# Patient Record
Sex: Male | Born: 1970
Health system: Southern US, Community
[De-identification: ages and names within clinical notes are randomized; demographics above are authoritative.]

## PROBLEM LIST (undated history)

## (undated) DIAGNOSIS — U071 COVID-19: Secondary | ICD-10-CM

## (undated) DIAGNOSIS — J302 Other seasonal allergic rhinitis: Secondary | ICD-10-CM

## (undated) DIAGNOSIS — I2510 Atherosclerotic heart disease of native coronary artery without angina pectoris: Secondary | ICD-10-CM

## (undated) DIAGNOSIS — I351 Nonrheumatic aortic (valve) insufficiency: Secondary | ICD-10-CM

## (undated) DIAGNOSIS — M6281 Muscle weakness (generalized): Secondary | ICD-10-CM

## (undated) HISTORY — PX: DENTAL SURGERY: SHX609

## (undated) HISTORY — PX: VASECTOMY: SHX75

## (undated) HISTORY — DX: COVID-19: U07.1

## (undated) HISTORY — DX: Muscle weakness (generalized): M62.81

## (undated) HISTORY — DX: Atherosclerotic heart disease of native coronary artery without angina pectoris: I25.10

## (undated) HISTORY — DX: Nonrheumatic aortic (valve) insufficiency: I35.1

---

## 2011-08-15 ENCOUNTER — Emergency Department (HOSPITAL_COMMUNITY)
Admission: EM | Admit: 2011-08-15 | Discharge: 2011-08-15 | Disposition: A | Discharge status: Home / Self Care | Payer: 59 | Source: Home / Self Care | Attending: Emergency Medicine | Admitting: Emergency Medicine

## 2011-08-15 ENCOUNTER — Encounter: Payer: Self-pay | Admitting: *Deleted

## 2011-08-15 DIAGNOSIS — J329 Chronic sinusitis, unspecified: Secondary | ICD-10-CM

## 2011-08-15 DIAGNOSIS — J40 Bronchitis, not specified as acute or chronic: Secondary | ICD-10-CM

## 2011-08-15 HISTORY — DX: Other seasonal allergic rhinitis: J30.2

## 2011-08-15 MED ORDER — FEXOFENADINE-PSEUDOEPHED ER 60-120 MG PO TB12: 1.0000 | ORAL_TABLET | Freq: Two times a day (BID) | ORAL | Status: AC

## 2011-08-15 MED ORDER — AMOXICILLIN 500 MG PO TABS: 500.0000 mg | ORAL_TABLET | Freq: Three times a day (TID) | ORAL | Status: AC

## 2011-08-15 NOTE — ED Provider Notes (Addendum)
History     CSN: [id number] Arrival date & time: 08/15/2011  1:37 PM   First MD Initiated Contact with Patient 08/15/11 1211      Chief Complaint  Patient presents with  . Nasal Congestion  . Cough    (Consider location/radiation/quality/duration/timing/severity/associated sxs/prior treatment) HPI Comments: Coughing for 3 weeks, and worsenin pressure on my sinuses, don like to come to the doctor, but the coughing is worse now, and now with colored phlegm "  Patient is a 40 y.o. male presenting with cough. The history is provided by the patient.  Cough This is a recurrent (3 weeks) problem. The problem has been gradually worsening. The cough is productive of sputum. The maximum temperature recorded prior to his arrival was 100 to 100.9 F. Associated symptoms include chills, sore throat and myalgias. Pertinent negatives include no shortness of breath and no wheezing. He has tried decongestants and cough syrup for the symptoms. The treatment provided no relief. He is not a smoker. His past medical history does not include pneumonia or asthma.    Past Medical History  Diagnosis Date  . Seasonal allergies     History reviewed. No pertinent past surgical history.  History reviewed. No pertinent family history.  History  Substance Use Topics  . Smoking status: Never Smoker   . Smokeless tobacco: Not on file  . Alcohol Use: Yes      Review of Systems  Constitutional: Positive for chills.  HENT: Positive for sore throat.   Respiratory: Positive for cough. Negative for shortness of breath and wheezing.   Musculoskeletal: Positive for myalgias.    Allergies  Review of patient's allergies indicates no known allergies.  Home Medications   Current Outpatient Rx  Name Route Sig Dispense Refill  . AMOXICILLIN 500 MG PO TABS Oral Take 1 tablet (500 mg total) by mouth 3 (three) times daily. X 10 DAYS 30 tablet 0  . FEXOFENADINE-PSEUDOEPHED ER 60-120 MG PO TB12 Oral Take 1 tablet  by mouth 2 (two) times daily. 14 tablet 0    BP 108/70  Pulse 75  Temp(Src) 98.1 F (36.7 C) (Oral)  Resp 20  SpO2 100%  Physical Exam  Nursing note and vitals reviewed. Constitutional: He appears well-developed and well-nourished.  HENT:  Head: Normocephalic.  Nose: Right sinus exhibits maxillary sinus tenderness and frontal sinus tenderness. Left sinus exhibits maxillary sinus tenderness and frontal sinus tenderness.  Eyes: Conjunctivae are normal. Right eye exhibits no discharge.  Neck: Trachea normal and normal range of motion. Neck supple. No JVD present.  Cardiovascular: Normal rate.   Pulmonary/Chest: Effort normal. No respiratory distress. He has no decreased breath sounds. He has no wheezes. He has no rhonchi. He has no rales. He exhibits no tenderness.  Lymphadenopathy:    He has no cervical adenopathy.    ED Course  Procedures (including critical care time)  Labs Reviewed - No data to display No results found.   1. Bronchitis   2. Sinusitis       MDM  Worsening ongoing Respiratoy and sinusitis  Symptoms, with decongestant and antihistamines        Jimmie Molly, MD 08/15/11 4098  Jimmie Molly, MD 08/15/11 2234

## 2011-08-15 NOTE — ED Notes (Signed)
Pt with c/o cough x 2 weeks /sinus congestion x one week - onset of facial pressure sinus onset today

## 2013-11-10 ENCOUNTER — Ambulatory Visit
Admission: RE | Admit: 2013-11-10 | Discharge: 2013-11-10 | Disposition: A | Discharge status: Home / Self Care | Payer: 59 | Source: Ambulatory Visit | Attending: Family Medicine | Admitting: Family Medicine

## 2013-11-10 ENCOUNTER — Other Ambulatory Visit: Payer: Self-pay | Admitting: Family Medicine

## 2013-11-10 DIAGNOSIS — M545 Low back pain, unspecified: Secondary | ICD-10-CM

## 2013-11-16 ENCOUNTER — Ambulatory Visit: Payer: 59 | Attending: Family Medicine

## 2013-11-16 DIAGNOSIS — R5381 Other malaise: Secondary | ICD-10-CM | POA: Insufficient documentation

## 2013-11-16 DIAGNOSIS — IMO0001 Reserved for inherently not codable concepts without codable children: Secondary | ICD-10-CM | POA: Insufficient documentation

## 2013-11-16 DIAGNOSIS — M25519 Pain in unspecified shoulder: Secondary | ICD-10-CM | POA: Insufficient documentation

## 2013-11-21 ENCOUNTER — Ambulatory Visit: Payer: 59

## 2013-11-23 ENCOUNTER — Ambulatory Visit: Payer: 59 | Admitting: Rehabilitation

## 2013-11-27 ENCOUNTER — Ambulatory Visit: Payer: 59

## 2013-11-29 ENCOUNTER — Ambulatory Visit: Payer: 59 | Admitting: Rehabilitation

## 2013-12-04 ENCOUNTER — Ambulatory Visit: Payer: 59

## 2013-12-07 ENCOUNTER — Ambulatory Visit: Payer: 59 | Attending: Family Medicine

## 2013-12-07 DIAGNOSIS — M25519 Pain in unspecified shoulder: Secondary | ICD-10-CM | POA: Insufficient documentation

## 2013-12-07 DIAGNOSIS — IMO0001 Reserved for inherently not codable concepts without codable children: Secondary | ICD-10-CM | POA: Insufficient documentation

## 2013-12-07 DIAGNOSIS — R5381 Other malaise: Secondary | ICD-10-CM | POA: Insufficient documentation

## 2013-12-13 ENCOUNTER — Other Ambulatory Visit (HOSPITAL_COMMUNITY): Payer: Self-pay | Admitting: Orthopedic Surgery

## 2013-12-13 ENCOUNTER — Ambulatory Visit: Payer: 59 | Admitting: Rehabilitation

## 2013-12-13 DIAGNOSIS — M25552 Pain in left hip: Secondary | ICD-10-CM

## 2013-12-15 ENCOUNTER — Ambulatory Visit: Payer: 59

## 2013-12-20 ENCOUNTER — Ambulatory Visit (HOSPITAL_COMMUNITY): Payer: 59

## 2013-12-26 ENCOUNTER — Ambulatory Visit (HOSPITAL_COMMUNITY): Payer: 59

## 2013-12-27 ENCOUNTER — Ambulatory Visit: Payer: 59

## 2013-12-28 ENCOUNTER — Ambulatory Visit (HOSPITAL_COMMUNITY)
Admission: RE | Admit: 2013-12-28 | Discharge: 2013-12-28 | Disposition: A | Discharge status: Home / Self Care | Payer: 59 | Source: Ambulatory Visit | Attending: Orthopedic Surgery | Admitting: Orthopedic Surgery

## 2013-12-28 DIAGNOSIS — M25552 Pain in left hip: Secondary | ICD-10-CM

## 2013-12-28 DIAGNOSIS — M25559 Pain in unspecified hip: Secondary | ICD-10-CM | POA: Insufficient documentation

## 2013-12-28 DIAGNOSIS — M549 Dorsalgia, unspecified: Secondary | ICD-10-CM | POA: Insufficient documentation

## 2014-01-08 ENCOUNTER — Ambulatory Visit: Payer: 59 | Attending: Family Medicine

## 2014-01-08 DIAGNOSIS — IMO0001 Reserved for inherently not codable concepts without codable children: Secondary | ICD-10-CM | POA: Insufficient documentation

## 2014-01-08 DIAGNOSIS — M25519 Pain in unspecified shoulder: Secondary | ICD-10-CM | POA: Insufficient documentation

## 2014-01-08 DIAGNOSIS — R5381 Other malaise: Secondary | ICD-10-CM | POA: Insufficient documentation

## 2014-01-16 ENCOUNTER — Ambulatory Visit: Payer: 59

## 2014-01-31 ENCOUNTER — Encounter (INDEPENDENT_AMBULATORY_CARE_PROVIDER_SITE_OTHER): Payer: Self-pay

## 2014-01-31 ENCOUNTER — Telehealth (INDEPENDENT_AMBULATORY_CARE_PROVIDER_SITE_OTHER): Payer: Self-pay

## 2014-01-31 ENCOUNTER — Ambulatory Visit (INDEPENDENT_AMBULATORY_CARE_PROVIDER_SITE_OTHER): Payer: Commercial Managed Care - PPO | Admitting: General Surgery

## 2014-01-31 ENCOUNTER — Encounter (INDEPENDENT_AMBULATORY_CARE_PROVIDER_SITE_OTHER): Payer: Self-pay | Admitting: General Surgery

## 2014-01-31 VITALS — BP 128/70 | HR 70 | Temp 97.2°F | Resp 12 | Ht 70.0 in | Wt 186.4 lb

## 2014-01-31 DIAGNOSIS — M25569 Pain in unspecified knee: Secondary | ICD-10-CM

## 2014-01-31 NOTE — Progress Notes (Signed)
Patient ID: Mitchell Melendez, male   DOB: [birth year], 43 y.o.   MRN: [id number]  Chief Complaint  Patient presents with  . New Evaluation    eval [facility]    HPI Mitchell Melendez is a 43 y.o. male.  The patient is a 43 year old male who is referred by Dr. Mayer Camel for evaluation of a possible left inguinal hernia. The patient states he had some issues after having it could not fall while playing basketball in February. The patient's undergone several bouts of PT. Subsequently he underwent MRI which examined the patient for possible hip issue, was essentially normal. So far the workup has been negative.    The patient describes some pain originated from his left hip/lower back area and does have some radiation to his left groin area. He has noticed no bulge. She does state that he has pain when sneezing, coughing, straining for bowel movements. He does not give any typical symptoms of having more discomfort in the day as opposed to beginning the day. The pain is better throughout the day. Patient is currently taking Ibuprofen every 8 hours to help with pain relief.  HPI  Past Medical History  Diagnosis Date  . Seasonal allergies     History reviewed. No pertinent past surgical history.  History reviewed. No pertinent family history.  Social History History  Substance Use Topics  . Smoking status: Never Smoker   . Smokeless tobacco: Never Used  . Alcohol Use: Yes    No Known Allergies  Current Outpatient Prescriptions  Medication Sig Dispense Refill  . Fexofenadine HCl (ALLEGRA PO) Take by mouth 1 day or 1 dose.      . ibuprofen (ADVIL,MOTRIN) 600 MG tablet Take 600 mg by mouth every 6 (six) hours as needed for mild pain.       No current facility-administered medications for this visit.    Review of Systems Review of Systems  Constitutional: Negative.   HENT: Negative.   Eyes: Negative.   Respiratory: Negative.   Cardiovascular: Negative.   Gastrointestinal: Negative.    Endocrine: Negative.   Neurological: Negative.     Blood pressure 128/70, pulse 70, temperature 97.2 F (36.2 C), resp. rate 12, height 5\' 10"  (1.778 m), weight 186 lb 6.4 oz (84.55 kg).  Physical Exam Physical Exam  Constitutional: He is oriented to person, place, and time. He appears well-developed and well-nourished.  HENT:  Head: Normocephalic and atraumatic.  Eyes: Conjunctivae and EOM are normal. Pupils are equal, round, and reactive to light.  Neck: Normal range of motion. Neck supple.  Cardiovascular: Normal rate, regular rhythm and normal heart sounds.   Pulmonary/Chest: Effort normal and breath sounds normal.  Abdominal: Soft. Bowel sounds are normal. He exhibits no distension and no mass. There is no tenderness. There is no rebound and no guarding. No hernia. Hernia confirmed negative in the right inguinal area and confirmed negative in the left inguinal area.  Musculoskeletal: Normal range of motion.  Neurological: He is alert and oriented to person, place, and time.  Skin: Skin is warm and dry.    Data Reviewed MRI revealed normal anatomy, no hernia seen on my review.  Assessment    43 year old male with left lower back, hip, groin pain. At this time could not palpate a left hernia his inguinal area. Upon reviewing the MRI of both inguinal canals normal did not appear to have hernias either side.    Plan    1. I would have the patient  follow  up with Dr. Mayer Camel  To evaluate for possible herniated disc. 2. The patient follow up as needed       Ralene Ok 01/31/2014, 10:23 AM

## 2014-01-31 NOTE — Telephone Encounter (Signed)
Faxed office notes to Dr. Mayer Camel @ [phone]

## 2014-02-16 ENCOUNTER — Other Ambulatory Visit (HOSPITAL_COMMUNITY): Payer: Self-pay | Admitting: Family Medicine

## 2014-02-16 DIAGNOSIS — N508 Other specified disorders of male genital organs: Secondary | ICD-10-CM

## 2014-02-19 ENCOUNTER — Ambulatory Visit (HOSPITAL_COMMUNITY)
Admission: RE | Admit: 2014-02-19 | Discharge: 2014-02-19 | Disposition: A | Discharge status: Home / Self Care | Payer: 59 | Source: Ambulatory Visit | Attending: Family Medicine | Admitting: Family Medicine

## 2014-02-19 DIAGNOSIS — N508 Other specified disorders of male genital organs: Secondary | ICD-10-CM

## 2014-02-19 DIAGNOSIS — I861 Scrotal varices: Secondary | ICD-10-CM | POA: Insufficient documentation

## 2014-02-19 DIAGNOSIS — N509 Disorder of male genital organs, unspecified: Secondary | ICD-10-CM | POA: Insufficient documentation

## 2014-09-25 ENCOUNTER — Other Ambulatory Visit (HOSPITAL_COMMUNITY): Payer: Self-pay | Admitting: Orthopedic Surgery

## 2014-09-25 DIAGNOSIS — M545 Low back pain: Secondary | ICD-10-CM

## 2014-10-12 ENCOUNTER — Ambulatory Visit (HOSPITAL_COMMUNITY)
Admission: RE | Admit: 2014-10-12 | Discharge: 2014-10-12 | Disposition: A | Discharge status: Home / Self Care | Payer: 59 | Source: Ambulatory Visit | Attending: Orthopedic Surgery | Admitting: Orthopedic Surgery

## 2014-10-12 DIAGNOSIS — M5126 Other intervertebral disc displacement, lumbar region: Secondary | ICD-10-CM | POA: Diagnosis not present

## 2014-10-12 DIAGNOSIS — Z9181 History of falling: Secondary | ICD-10-CM | POA: Insufficient documentation

## 2014-10-12 DIAGNOSIS — R9089 Other abnormal findings on diagnostic imaging of central nervous system: Secondary | ICD-10-CM | POA: Diagnosis not present

## 2014-10-12 DIAGNOSIS — M545 Low back pain: Secondary | ICD-10-CM | POA: Diagnosis present

## 2015-11-01 DIAGNOSIS — J3081 Allergic rhinitis due to animal (cat) (dog) hair and dander: Secondary | ICD-10-CM | POA: Diagnosis not present

## 2015-11-01 DIAGNOSIS — J301 Allergic rhinitis due to pollen: Secondary | ICD-10-CM | POA: Diagnosis not present

## 2015-11-01 DIAGNOSIS — J3089 Other allergic rhinitis: Secondary | ICD-10-CM | POA: Diagnosis not present

## 2015-11-01 DIAGNOSIS — H1045 Other chronic allergic conjunctivitis: Secondary | ICD-10-CM | POA: Diagnosis not present

## 2015-11-01 MED FILL — EPINEPHRINE 0.3 MG AUTO-INJ: 0.3 | 30 days supply | Qty: 2 | Fill #0

## 2015-11-01 MED FILL — FLUTICASONE PROP 50 MCG SPR: 50 | 30 days supply | Qty: 16 | Fill #0

## 2015-11-01 MED FILL — LEVOCETIRIZINE 5 MG TABLET: 5 | 30 days supply | Qty: 30 | Fill #0

## 2015-11-01 MED FILL — MONTELUKAST SOD 10 MG TAB: 10 | 30 days supply | Qty: 30 | Fill #0

## 2015-11-11 DIAGNOSIS — J3089 Other allergic rhinitis: Secondary | ICD-10-CM | POA: Diagnosis not present

## 2015-11-11 DIAGNOSIS — J3081 Allergic rhinitis due to animal (cat) (dog) hair and dander: Secondary | ICD-10-CM | POA: Diagnosis not present

## 2015-11-11 DIAGNOSIS — J301 Allergic rhinitis due to pollen: Secondary | ICD-10-CM | POA: Diagnosis not present

## 2015-12-05 DIAGNOSIS — J301 Allergic rhinitis due to pollen: Secondary | ICD-10-CM | POA: Diagnosis not present

## 2015-12-05 DIAGNOSIS — J3089 Other allergic rhinitis: Secondary | ICD-10-CM | POA: Diagnosis not present

## 2015-12-05 DIAGNOSIS — J3081 Allergic rhinitis due to animal (cat) (dog) hair and dander: Secondary | ICD-10-CM | POA: Diagnosis not present

## 2015-12-05 MED FILL — MONTELUKAST SOD 10 MG TAB: 10 | 30 days supply | Qty: 30 | Fill #1

## 2015-12-05 MED FILL — LEVOCETIRIZINE 5 MG TABLET: 5 | 30 days supply | Qty: 30 | Fill #1

## 2015-12-10 DIAGNOSIS — J3089 Other allergic rhinitis: Secondary | ICD-10-CM | POA: Diagnosis not present

## 2015-12-10 DIAGNOSIS — J3081 Allergic rhinitis due to animal (cat) (dog) hair and dander: Secondary | ICD-10-CM | POA: Diagnosis not present

## 2015-12-10 DIAGNOSIS — J301 Allergic rhinitis due to pollen: Secondary | ICD-10-CM | POA: Diagnosis not present

## 2015-12-13 DIAGNOSIS — J3081 Allergic rhinitis due to animal (cat) (dog) hair and dander: Secondary | ICD-10-CM | POA: Diagnosis not present

## 2015-12-13 DIAGNOSIS — J301 Allergic rhinitis due to pollen: Secondary | ICD-10-CM | POA: Diagnosis not present

## 2015-12-13 DIAGNOSIS — J3089 Other allergic rhinitis: Secondary | ICD-10-CM | POA: Diagnosis not present

## 2015-12-16 DIAGNOSIS — J301 Allergic rhinitis due to pollen: Secondary | ICD-10-CM | POA: Diagnosis not present

## 2015-12-16 DIAGNOSIS — J3089 Other allergic rhinitis: Secondary | ICD-10-CM | POA: Diagnosis not present

## 2015-12-16 DIAGNOSIS — J3081 Allergic rhinitis due to animal (cat) (dog) hair and dander: Secondary | ICD-10-CM | POA: Diagnosis not present

## 2015-12-18 DIAGNOSIS — J3089 Other allergic rhinitis: Secondary | ICD-10-CM | POA: Diagnosis not present

## 2015-12-18 DIAGNOSIS — J301 Allergic rhinitis due to pollen: Secondary | ICD-10-CM | POA: Diagnosis not present

## 2015-12-18 DIAGNOSIS — J3081 Allergic rhinitis due to animal (cat) (dog) hair and dander: Secondary | ICD-10-CM | POA: Diagnosis not present

## 2015-12-23 DIAGNOSIS — J301 Allergic rhinitis due to pollen: Secondary | ICD-10-CM | POA: Diagnosis not present

## 2015-12-23 DIAGNOSIS — J3081 Allergic rhinitis due to animal (cat) (dog) hair and dander: Secondary | ICD-10-CM | POA: Diagnosis not present

## 2015-12-23 DIAGNOSIS — J3089 Other allergic rhinitis: Secondary | ICD-10-CM | POA: Diagnosis not present

## 2015-12-27 DIAGNOSIS — J301 Allergic rhinitis due to pollen: Secondary | ICD-10-CM | POA: Diagnosis not present

## 2015-12-27 DIAGNOSIS — J3089 Other allergic rhinitis: Secondary | ICD-10-CM | POA: Diagnosis not present

## 2015-12-27 DIAGNOSIS — J3081 Allergic rhinitis due to animal (cat) (dog) hair and dander: Secondary | ICD-10-CM | POA: Diagnosis not present

## 2016-01-01 DIAGNOSIS — J3089 Other allergic rhinitis: Secondary | ICD-10-CM | POA: Diagnosis not present

## 2016-01-01 DIAGNOSIS — J3081 Allergic rhinitis due to animal (cat) (dog) hair and dander: Secondary | ICD-10-CM | POA: Diagnosis not present

## 2016-01-01 DIAGNOSIS — J301 Allergic rhinitis due to pollen: Secondary | ICD-10-CM | POA: Diagnosis not present

## 2016-01-03 MED FILL — LEVOCETIRIZINE 5 MG TABLET: 5 | 30 days supply | Qty: 30 | Fill #2

## 2016-01-03 MED FILL — MONTELUKAST SOD 10 MG TAB: 10 | 30 days supply | Qty: 30 | Fill #2

## 2016-01-06 DIAGNOSIS — J3089 Other allergic rhinitis: Secondary | ICD-10-CM | POA: Diagnosis not present

## 2016-01-06 DIAGNOSIS — J301 Allergic rhinitis due to pollen: Secondary | ICD-10-CM | POA: Diagnosis not present

## 2016-01-06 DIAGNOSIS — J3081 Allergic rhinitis due to animal (cat) (dog) hair and dander: Secondary | ICD-10-CM | POA: Diagnosis not present

## 2016-01-10 DIAGNOSIS — J301 Allergic rhinitis due to pollen: Secondary | ICD-10-CM | POA: Diagnosis not present

## 2016-01-10 DIAGNOSIS — J3089 Other allergic rhinitis: Secondary | ICD-10-CM | POA: Diagnosis not present

## 2016-01-10 DIAGNOSIS — J3081 Allergic rhinitis due to animal (cat) (dog) hair and dander: Secondary | ICD-10-CM | POA: Diagnosis not present

## 2016-01-15 DIAGNOSIS — J3089 Other allergic rhinitis: Secondary | ICD-10-CM | POA: Diagnosis not present

## 2016-01-15 DIAGNOSIS — J301 Allergic rhinitis due to pollen: Secondary | ICD-10-CM | POA: Diagnosis not present

## 2016-01-15 DIAGNOSIS — J3081 Allergic rhinitis due to animal (cat) (dog) hair and dander: Secondary | ICD-10-CM | POA: Diagnosis not present

## 2016-01-21 DIAGNOSIS — J3089 Other allergic rhinitis: Secondary | ICD-10-CM | POA: Diagnosis not present

## 2016-01-21 DIAGNOSIS — J301 Allergic rhinitis due to pollen: Secondary | ICD-10-CM | POA: Diagnosis not present

## 2016-01-21 DIAGNOSIS — J3081 Allergic rhinitis due to animal (cat) (dog) hair and dander: Secondary | ICD-10-CM | POA: Diagnosis not present

## 2016-01-24 DIAGNOSIS — J301 Allergic rhinitis due to pollen: Secondary | ICD-10-CM | POA: Diagnosis not present

## 2016-01-24 DIAGNOSIS — J3081 Allergic rhinitis due to animal (cat) (dog) hair and dander: Secondary | ICD-10-CM | POA: Diagnosis not present

## 2016-01-24 DIAGNOSIS — J3089 Other allergic rhinitis: Secondary | ICD-10-CM | POA: Diagnosis not present

## 2016-01-27 MED FILL — FLUTICASONE PROP 50 MCG SPR: 50 | 30 days supply | Qty: 16 | Fill #1

## 2016-01-28 DIAGNOSIS — J301 Allergic rhinitis due to pollen: Secondary | ICD-10-CM | POA: Diagnosis not present

## 2016-01-28 DIAGNOSIS — J3089 Other allergic rhinitis: Secondary | ICD-10-CM | POA: Diagnosis not present

## 2016-01-28 DIAGNOSIS — J3081 Allergic rhinitis due to animal (cat) (dog) hair and dander: Secondary | ICD-10-CM | POA: Diagnosis not present

## 2016-01-31 DIAGNOSIS — J3089 Other allergic rhinitis: Secondary | ICD-10-CM | POA: Diagnosis not present

## 2016-01-31 DIAGNOSIS — J301 Allergic rhinitis due to pollen: Secondary | ICD-10-CM | POA: Diagnosis not present

## 2016-01-31 DIAGNOSIS — J3081 Allergic rhinitis due to animal (cat) (dog) hair and dander: Secondary | ICD-10-CM | POA: Diagnosis not present

## 2016-02-04 DIAGNOSIS — J3081 Allergic rhinitis due to animal (cat) (dog) hair and dander: Secondary | ICD-10-CM | POA: Diagnosis not present

## 2016-02-04 DIAGNOSIS — J301 Allergic rhinitis due to pollen: Secondary | ICD-10-CM | POA: Diagnosis not present

## 2016-02-04 DIAGNOSIS — J3089 Other allergic rhinitis: Secondary | ICD-10-CM | POA: Diagnosis not present

## 2016-02-04 MED FILL — LEVOCETIRIZINE 5 MG TABLET: 5 | 30 days supply | Qty: 30 | Fill #3

## 2016-02-04 MED FILL — MONTELUKAST SOD 10 MG TAB: 10 | 30 days supply | Qty: 30 | Fill #3

## 2016-02-07 DIAGNOSIS — J301 Allergic rhinitis due to pollen: Secondary | ICD-10-CM | POA: Diagnosis not present

## 2016-02-07 DIAGNOSIS — J3081 Allergic rhinitis due to animal (cat) (dog) hair and dander: Secondary | ICD-10-CM | POA: Diagnosis not present

## 2016-02-07 DIAGNOSIS — J3089 Other allergic rhinitis: Secondary | ICD-10-CM | POA: Diagnosis not present

## 2016-02-11 ENCOUNTER — Emergency Department (HOSPITAL_COMMUNITY)
Admission: EM | Admit: 2016-02-11 | Discharge: 2016-02-11 | Disposition: A | Discharge status: Home / Self Care | Payer: 59 | Attending: Emergency Medicine | Admitting: Emergency Medicine

## 2016-02-11 ENCOUNTER — Encounter (HOSPITAL_COMMUNITY): Payer: Self-pay | Admitting: Emergency Medicine

## 2016-02-11 DIAGNOSIS — T7840XA Allergy, unspecified, initial encounter: Secondary | ICD-10-CM | POA: Diagnosis not present

## 2016-02-11 DIAGNOSIS — J3081 Allergic rhinitis due to animal (cat) (dog) hair and dander: Secondary | ICD-10-CM | POA: Diagnosis not present

## 2016-02-11 DIAGNOSIS — J3089 Other allergic rhinitis: Secondary | ICD-10-CM | POA: Diagnosis not present

## 2016-02-11 DIAGNOSIS — L509 Urticaria, unspecified: Secondary | ICD-10-CM | POA: Insufficient documentation

## 2016-02-11 DIAGNOSIS — J301 Allergic rhinitis due to pollen: Secondary | ICD-10-CM | POA: Diagnosis not present

## 2016-02-11 MED ORDER — DIPHENHYDRAMINE HCL 25 MG PO CAPS
25.0000 mg | ORAL_CAPSULE | Freq: Once | ORAL | Status: AC
  Administered 2016-02-11: 25 mg via ORAL
  Filled 2016-02-11: qty 1

## 2016-02-11 MED ORDER — EPINEPHRINE 0.3 MG/0.3ML IJ SOAJ: 0.3000 mg | Freq: Once | INTRAMUSCULAR | Status: DC

## 2016-02-11 MED ORDER — PREDNISONE 20 MG PO TABS
60.0000 mg | ORAL_TABLET | Freq: Once | ORAL | Status: AC
  Administered 2016-02-11: 60 mg via ORAL
  Filled 2016-02-11: qty 3
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
## 2016-02-11 NOTE — ED Provider Notes (Signed)
CSN: [id number]     Arrival date & time 02/11/16  1248 History  By signing my name below, I, Mitchell Melendez, attest that this documentation has been prepared under the direction and in the presence of Shary Decamp, PA-C Electronically Signed: Soijett Melendez, ED Scribe. 02/11/2016. 1:25 PM.   Chief Complaint  Patient presents with  . Allergic Reaction    allergic reaction to allergy shot      The history is provided by the patient. No language interpreter was used.    HPI Comments: Mitchell Melendez is a 45 y.o. male who presents to the Emergency Department complaining of allergic reaction onset 11:30 AM PTA. Pt notes that he began to have pruritic hives this morning following seeing his allergist and receiving allergy shot one of a series for seasonal allergies. Pt states that he has been taking the allergy shots x 3 months. Pt states that while on the way to his office, he felt a rash and the pt went home and self-administered an Epipen to himself at noon today. Pt reports that he was informed by his allergist to come into the ED. Pt notes that his pruritic hives have resolved since onset of his symptoms, but there is still mild redness to his upper body. Pt states that he has been getting allergy shots since age 78 and this is the first time that he has used the epi pen. He states that he is having associated symptoms of redness. He states that he has tried epi-pen for the relief for his symptoms. He denies throat closing sensation, SOB, and any other symptoms.   Past Medical History  Diagnosis Date  . Seasonal allergies    No past surgical history on file. No family history on file. Social History  Substance Use Topics  . Smoking status: Never Smoker   . Smokeless tobacco: Never Used  . Alcohol Use: Yes    Review of Systems  HENT: Negative for trouble swallowing.   Respiratory: Negative for shortness of breath.   Skin: Positive for color change. Negative for rash.   Allergies  Review  of patient's allergies indicates no known allergies.  Home Medications   Prior to Admission medications   Medication Sig Start Date End Date Taking? Authorizing Provider  Fexofenadine HCl (ALLEGRA PO) Take by mouth 1 day or 1 dose.    Historical Provider, MD  ibuprofen (ADVIL,MOTRIN) 600 MG tablet Take 600 mg by mouth every 6 (six) hours as needed for mild pain.    Historical Provider, MD   BP 118/76 mmHg  Pulse 81  Temp(Src) 97.7 F (36.5 C) (Oral)  Resp 18  Wt 188 lb (85.276 kg)  SpO2 100%   Physical Exam  Constitutional: He is oriented to person, place, and time. He appears well-developed and well-nourished. No distress.  HENT:  Head: Normocephalic and atraumatic.  Eyes: EOM are normal.  Neck: Neck supple.  Cardiovascular: Normal rate, regular rhythm and normal heart sounds.  Exam reveals no gallop and no friction rub.   No murmur heard. Pulmonary/Chest: Effort normal and breath sounds normal. No respiratory distress. He has no wheezes. He has no rales.  NAD. Able to phonate well without difficulty  Abdominal: He exhibits no distension.  Musculoskeletal: Normal range of motion.  Neurological: He is alert and oriented to person, place, and time.  Skin: Skin is warm and dry.  No urticarial rash. No angioedema.   Psychiatric: He has a normal mood and affect. His behavior is normal.  Nursing note and vitals reviewed.   ED Course  Procedures (including critical care time) DIAGNOSTIC STUDIES: Oxygen Saturation is 100% on RA, nl by my interpretation.    COORDINATION OF CARE: 1:24 PM Discussed treatment plan with pt at bedside which includes refill epipen, benadryl, and follow up with allergist, and pt agreed to plan.  Labs Review Labs Reviewed - No data to display  Imaging Review No results found.    EKG Interpretation None      MDM  I have reviewed the relevant previous healthcare records. I obtained HPI from historian. Patient discussed with supervising  physician  ED Course:  Assessment: Patient re-evaluated prior to dc, is hemodynamically stable, in no respiratory distress, and denies the feeling of throat closing, normal phonation. No wheezing, no vomiting, no syncope. Discussed signs and symptoms of anaphylaxis and severe allergic reaction. Pt advised to return for any worsening in symptoms or any concerns. Pt treated with benadryl and prednisone while in the ED. Pt epi-pen refilled. Pt is to follow up with their allergist. Pt is agreeable with plan & verbalizes understanding.  Disposition/Plan:  DC Home Additional Verbal discharge instructions given and discussed with patient.  Pt Instructed to f/u with PCP in the next week for evaluation and treatment of symptoms. Return precautions given Pt acknowledges and agrees with plan  Supervising Physician Lacretia Leigh, MD   Final diagnoses:  Urticaria  Allergic reaction, initial encounter      I personally performed the services described in this documentation, which was scribed in my presence. The recorded information has been reviewed and is accurate.    Shary Decamp, PA-C 02/11/16 1334  Lacretia Leigh, MD 02/12/16 [phone]

## 2016-02-12 DIAGNOSIS — J3081 Allergic rhinitis due to animal (cat) (dog) hair and dander: Secondary | ICD-10-CM | POA: Diagnosis not present

## 2016-02-12 DIAGNOSIS — J301 Allergic rhinitis due to pollen: Secondary | ICD-10-CM | POA: Diagnosis not present

## 2016-02-12 DIAGNOSIS — J3089 Other allergic rhinitis: Secondary | ICD-10-CM | POA: Diagnosis not present

## 2016-02-12 DIAGNOSIS — H1045 Other chronic allergic conjunctivitis: Secondary | ICD-10-CM | POA: Diagnosis not present

## 2016-02-21 MED FILL — EPINEPHRINE 0.3 MG AUTO-INJ: 0.3 | 30 days supply | Qty: 2 | Fill #0

## 2016-03-17 MED FILL — LEVOCETIRIZINE 5 MG TABLET: 5 | 30 days supply | Qty: 30 | Fill #4

## 2016-03-17 MED FILL — MONTELUKAST SOD 10 MG TAB: 10 | 30 days supply | Qty: 30 | Fill #4

## 2016-03-31 DIAGNOSIS — J3089 Other allergic rhinitis: Secondary | ICD-10-CM | POA: Diagnosis not present

## 2016-03-31 DIAGNOSIS — J3081 Allergic rhinitis due to animal (cat) (dog) hair and dander: Secondary | ICD-10-CM | POA: Diagnosis not present

## 2016-03-31 DIAGNOSIS — J301 Allergic rhinitis due to pollen: Secondary | ICD-10-CM | POA: Diagnosis not present

## 2016-03-31 DIAGNOSIS — H1045 Other chronic allergic conjunctivitis: Secondary | ICD-10-CM | POA: Diagnosis not present

## 2016-04-09 DIAGNOSIS — H5213 Myopia, bilateral: Secondary | ICD-10-CM | POA: Diagnosis not present

## 2016-06-04 MED FILL — MONTELUKAST SOD 10 MG TAB: 10 | 30 days supply | Qty: 30 | Fill #5

## 2016-06-04 MED FILL — LEVOCETIRIZINE 5 MG TABLET: 5 | 30 days supply | Qty: 30 | Fill #5

## 2016-06-04 MED FILL — FLUTICASONE PROP 50 MCG SPR: 50 | 30 days supply | Qty: 16 | Fill #2

## 2016-08-25 MED FILL — LEVOCETIRIZINE 5 MG TABLET: 5 | 30 days supply | Qty: 30 | Fill #0

## 2016-08-25 MED FILL — MONTELUKAST SOD 10 MG TAB: 10 | 30 days supply | Qty: 30 | Fill #0

## 2016-08-28 DIAGNOSIS — D696 Thrombocytopenia, unspecified: Secondary | ICD-10-CM | POA: Diagnosis not present

## 2016-08-28 DIAGNOSIS — Z0000 Encounter for general adult medical examination without abnormal findings: Secondary | ICD-10-CM | POA: Diagnosis not present

## 2016-08-28 DIAGNOSIS — R7301 Impaired fasting glucose: Secondary | ICD-10-CM | POA: Diagnosis not present

## 2016-08-28 DIAGNOSIS — Z125 Encounter for screening for malignant neoplasm of prostate: Secondary | ICD-10-CM | POA: Diagnosis not present

## 2016-08-28 DIAGNOSIS — M545 Low back pain: Secondary | ICD-10-CM | POA: Diagnosis not present

## 2016-08-28 DIAGNOSIS — R142 Eructation: Secondary | ICD-10-CM | POA: Diagnosis not present

## 2016-08-28 DIAGNOSIS — E7800 Pure hypercholesterolemia, unspecified: Secondary | ICD-10-CM | POA: Diagnosis not present

## 2016-08-28 DIAGNOSIS — J309 Allergic rhinitis, unspecified: Secondary | ICD-10-CM | POA: Diagnosis not present

## 2016-11-27 MED FILL — MONTELUKAST SOD 10 MG TAB: 10 | 30 days supply | Qty: 30 | Fill #1

## 2016-11-27 MED FILL — LEVOCETIRIZINE 5 MG TABLET: 5 | 30 days supply | Qty: 30 | Fill #1

## 2017-01-04 MED FILL — LEVOCETIRIZINE 5 MG TABLET: 5 | 30 days supply | Qty: 30 | Fill #2

## 2017-01-04 MED FILL — MONTELUKAST SOD 10 MG TAB: 10 | 30 days supply | Qty: 30 | Fill #2

## 2017-02-05 MED FILL — LEVOCETIRIZINE 5 MG TABLET: 5 | 30 days supply | Qty: 30 | Fill #3

## 2017-02-05 MED FILL — MONTELUKAST SOD 10 MG TAB: 10 | 30 days supply | Qty: 30 | Fill #3

## 2017-02-16 ENCOUNTER — Ambulatory Visit (INDEPENDENT_AMBULATORY_CARE_PROVIDER_SITE_OTHER): Payer: Self-pay | Admitting: Nurse Practitioner

## 2017-02-16 ENCOUNTER — Encounter: Payer: Self-pay | Admitting: Nurse Practitioner

## 2017-02-16 VITALS — BP 102/70 | HR 58 | Temp 98.4°F | Resp 16 | Ht 70.0 in | Wt 190.8 lb

## 2017-02-16 DIAGNOSIS — Z0000 Encounter for general adult medical examination without abnormal findings: Secondary | ICD-10-CM

## 2017-02-16 NOTE — Progress Notes (Signed)
Subjective:  Mitchell Melendez is a 46 y.o. 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 Patient denies any current health related concerns. The patient only has a past medical history of seasonal allergies. Patient is taking Xyzal and Singulair with great results.  There is no immunization history on file for this patient.  Past Medical History:  Diagnosis Date  . Seasonal allergies     Past Surgical History:  Procedure Laterality Date  . DENTAL SURGERY    . VASECTOMY      Social History  Substance Use Topics  . Smoking status: Never Smoker  . Smokeless tobacco: Never Used  . Alcohol use Yes    No Known Allergies  Current Outpatient Prescriptions  Medication Sig Dispense Refill  . EPINEPHrine (ADRENALIN) 0.1 % nasal solution Place 1 drop into the nose once.     [name] Kitchen EPINEPHrine 0.3 mg/0.3 mL IJ SOAJ injection Inject 0.3 mLs (0.3 mg total) into the muscle once. 1 Device 0  . Fexofenadine HCl (ALLEGRA PO) Take by mouth 1 day or 1 dose.    . ibuprofen (ADVIL,MOTRIN) 600 MG tablet Take 600 mg by mouth every 6 (six) hours as needed for mild pain.     No current facility-administered medications for this visit.     Review of Systems  Constitutional: Negative.   HENT: Negative.   Eyes: Negative.   Respiratory: Negative.   Cardiovascular: Negative.   Gastrointestinal: Negative.   Musculoskeletal: Negative.   Skin: Negative.   Neurological: Negative.   Endo/Heme/Allergies: Positive for environmental allergies.  Psychiatric/Behavioral: Negative.      Objective:  BP 102/70 (BP Location: Right Arm, Patient Position: Sitting, Cuff Size: Normal)   Pulse (!) 58   Temp 98.4 F (36.9 C) (Oral)   Resp 16   Ht 5\' 10"  (1.778 m)   Wt 190 lb 12.8 oz (86.5 kg)   SpO2 98%   BMI 27.38 kg/m   General Appearance:  Alert, cooperative, no distress, appears stated age  Head:  Normocephalic, without obvious abnormality, atraumatic  Eyes:  PERRL,  conjunctiva/corneas clear, EOM's intact, fundi benign, both eyes  Ears:  Normal TM's and external ear canals, both ears  Nose: Nares normal, septum midline, mucosa normal, no drainage or sinus tenderness  Throat: Lips, mucosa, and tongue normal; teeth and gums normal  Neck: Supple, symmetrical, trachea midline, no adenopathy, thyroid: not enlarged, symmetric, no tenderness/mass/nodules, no carotid bruit or JVD  Back:   Symmetric, no curvature, ROM normal, no CVA tenderness  Lungs:   Clear to auscultation bilaterally, respirations unlabored     Heart:  Regular rate and rhythm, S1, S2 normal, no murmur, rub or gallop  Abdomen:   Soft, non-tender, bowel sounds active all four quadrants,  no masses, no organomegaly        Extremities: Extremities normal, atraumatic, no cyanosis or edema  Pulses: 2+ and symmetric  Skin: Skin color, texture, turgor normal, no rashes or lesions  Lymph nodes: Cervical, supraclavicular, and axillary nodes normal  Neurologic: Normal        Assessment:  basic physical exam, normal exam    Plan:  Patient education provided.  No labs needed at this time. No physical limitations. Patient will follow up with PCP.

## 2017-02-16 NOTE — Patient Instructions (Addendum)
Allergies, Adult An allergy is when your body's defense system (immune system) overreacts to an otherwise harmless substance (allergen) that you breathe in or eat or something that touches your skin. When you come into contact with something that you are allergic to, your immune system produces certain proteins (antibodies). These proteins cause cells to release chemicals (histamines) that trigger the symptoms of an allergic reaction. Allergies often affect the nasal passages (allergic rhinitis), eyes (allergic conjunctivitis), skin (atopic dermatitis), and stomach. Allergies can be mild or severe. Allergies cannot spread from person to person (are not contagious). They can develop at any age and may be outgrown. What increases the risk? You may be at greater risk of allergies if other people in your family have allergies. What are the signs or symptoms? Symptoms depend on what type of allergy you have. They may include:  Runny, stuffy nose.  Sneezing.  Itchy mouth, ears, or throat.  Postnasal drip.  Sore throat.  Itchy, red, watery, or puffy eyes.  Skin rash or hives.  Stomach pain.  Vomiting.  Diarrhea.  Bloating.  Wheezing or coughing.  People with a severe allergy to food, medicine, or an insect bite may have a life-threatening allergic reaction (anaphylaxis). Symptoms of anaphylaxis include:  Hives.  Itching.  Flushed face.  Swollen lips, tongue, or mouth.  Tight or swollen throat.  Chest pain or tightness in the chest.  Trouble breathing or shortness of breath.  Rapid heartbeat.  Dizziness or fainting.  Vomiting.  Diarrhea.  Pain in the abdomen.  How is this diagnosed? This condition is diagnosed based on:  Your symptoms.  Your family and medical history.  A physical exam.  You may need to see a health care provider who specializes in treating allergies (allergist). You may also have tests, including:  Skin tests to see which allergens are  causing your symptoms, such as: ? Skin prick test. In this test, your skin is pricked with a tiny needle and exposed to small amounts of possible allergens to see if your skin reacts. ? Intradermal skin test. In this test, a small amount of allergen is injected under your skin to see if your skin reacts. ? Patch test. In this test, a small amount of allergen is placed on your skin and then your skin is covered with a bandage. Your health care provider will check your skin after a couple of days to see if a rash has developed.  Blood tests.  Challenges tests. In this test, you inhale a small amount of allergen by mouth to see if you have an allergic reaction.  You may also be asked to:  Keep a food diary. A food diary is a record of all the foods and drinks you have in a day and any symptoms you experience.  Practice an elimination diet. An elimination diet involves eliminating specific foods from your diet and then adding them back in one by one to find out if a certain food causes an allergic reaction.  How is this treated? Treatment for allergies depends on your symptoms. Treatment may include:  Cold compresses to soothe itching and swelling.  Eye drops.  Nasal sprays.  Using a saline spray or container (neti pot) to flush out the nose (nasal irrigation). These methods can help clear away mucus and keep the nasal passages moist.  Using a humidifier.  Oral antihistamines or other medicines to block allergic reaction and inflammation.  Skin creams to treat rashes or itching.  Diet changes to   eliminate food allergy triggers.  Repeated exposure to tiny amounts of allergens to build up a tolerance and prevent future allergic reactions (immunotherapy). These include: ? Allergy shots. ? Oral treatment. This involves taking small doses of an allergen under the tongue (sublingual immunotherapy).  Emergency epinephrine injection (auto-injector) in case of an allergic emergency. This is  a self-injectable, pre-measured medicine that must be given within the first few minutes of a serious allergic reaction.  Follow these instructions at home:  Avoid known allergens whenever possible.  If you suffer from airborne allergens, wash out your nose daily. You can do this with a saline spray or a neti pot to flush out your nose (nasal irrigation).  Take over-the-counter and prescription medicines only as told by your health care provider.  Keep all follow-up visits as told by your health care provider. This is important.  If you are at risk of a severe allergic reaction (anaphylaxis), keep your auto-injector with you at all times.  If you have ever had anaphylaxis, wear a medical alert bracelet or necklace that states you have a severe allergy. Contact a health care provider if:  Your symptoms do not improve with treatment. Get help right away if:  You have symptoms of anaphylaxis, such as: ? Swollen mouth, tongue, or throat. ? Pain or tightness in your chest. ? Trouble breathing or shortness of breath. ? Dizziness or fainting. ? Severe abdominal pain, vomiting, or diarrhea. This information is not intended to replace advice given to you by your health care provider. Make sure you discuss any questions you have with your health care provider. Document Released: 11/17/2002 Document Revised: 04/23/2016 Document Reviewed: 03/11/2016 Elsevier Interactive Patient Education  2018 [place], Adult An insect bite can make your skin red, itchy, and swollen. An insect bite is different from an insect sting, which happens when an insect injects poison (venom) into the skin. Some insects can spread disease to people through a bite. However, most insect bites do not lead to disease and are not serious. What are the causes? Insects may bite for a variety of reasons, including:  Hunger.  To defend themselves.  Insects that bite  include:  Spiders.  Mosquitoes.  Ticks.  Fleas.  Ants.  Flies.  Bedbugs.  What are the signs or symptoms? Symptoms of this condition include:  Itching or pain in the bite area.  Redness and swelling in the bite area.  An open wound (skin ulcer).  In many cases, symptoms last for 2-4 days. How is this diagnosed? This condition is usually diagnosed based on symptoms and a physical exam. How is this treated? Treatment is usually not needed. Symptoms often go away on their own. When treatment is recommended, it may involve:  Applying a cream or lotion to the bitten area. This treatment helps with itching.  Taking an antibiotic medicine. This treatment is needed if the bite area gets infected.  Getting a tetanus shot.  Applying ice to the affected area.  Medicines called antihistamines. This treatment is needed if you develop an allergic reaction to the insect bite.  Follow these instructions at home: Bite area care  Do not scratch the bite area.  Keep the bite area clean and dry. Wash it every day with soap and water as told by your health care provider.  Check the bite area every day for signs of infection. Check for: ? More redness, swelling, or pain. ? Fluid or blood. ? Warmth. ? Pus. Managing pain,  itching, and swelling   You may apply a baking soda paste, cortisone cream, or calamine lotion to the bite area as told by your health care provider.  If directed, applyice to the bite area. ? Put ice in a plastic bag. ? Place a towel between your skin and the bag. ? Leave the ice on for 20 minutes, 2-3 times per day. Medicines  Apply or take over-the-counter and prescription medicines only as told by your health care provider.  If you were prescribed an antibiotic medicine, use it as told by your health care provider. Do not stop using the antibiotic even if your condition improves. General instructions  Keep all follow-up visits as told by your health  care provider. This is important. How is this prevented? To help reduce your risk of insect bites:  When you are outdoors, wear clothing that covers your arms and legs.  Use insect repellent. The best insect repellents contain: ? DEET, picaridin, oil of lemon eucalyptus (OLE), or IR3535. ? Higher amounts of an active ingredient.  If your home windows do not have screens, consider installing them.  Contact a health care provider if:  You have more redness, swelling, or pain in the bite area.  You have fluid, blood, or pus coming from the bite area.  The bite area feels warm to the touch.  You have a fever. Get help right away if:  You have joint pain.  You have a rash.  You have shortness of breath.  You feel unusually tired or sleepy.  You have neck pain.  You have a headache.  You have unusual weakness.  You have chest pain.  You have nausea, vomiting, or pain in the abdomen. This information is not intended to replace advice given to you by your health care provider. Make sure you discuss any questions you have with your health care provider. Document Released: 10/01/2004 Document Revised: 04/22/2016 Document Reviewed: 03/02/2016 Elsevier Interactive Patient Education  [employer].

## 2017-05-17 DIAGNOSIS — H5213 Myopia, bilateral: Secondary | ICD-10-CM | POA: Diagnosis not present

## 2017-05-17 DIAGNOSIS — H524 Presbyopia: Secondary | ICD-10-CM | POA: Diagnosis not present

## 2018-02-07 ENCOUNTER — Ambulatory Visit: Payer: Self-pay | Admitting: Family Medicine

## 2018-02-07 VITALS — HR 67 | Ht 70.0 in | Wt 193.6 lb

## 2018-02-07 DIAGNOSIS — Z0000 Encounter for general adult medical examination without abnormal findings: Secondary | ICD-10-CM

## 2018-02-07 NOTE — Patient Instructions (Signed)

## 2018-02-07 NOTE — Progress Notes (Signed)
Mitchell Melendez is a 47 y.o. 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 He denies chronic health conditions or acute illness at this time.  Review of Systems  Constitutional: Negative for chills, fever and malaise/fatigue.  HENT: Negative for congestion, ear discharge, ear pain, sinus pain and sore throat.   Eyes: Negative.   Respiratory: Negative for cough, sputum production and shortness of breath.   Cardiovascular: Negative.  Negative for chest pain.  Gastrointestinal: Negative for abdominal pain, diarrhea, nausea and vomiting.  Genitourinary: Negative for dysuria, frequency, hematuria and urgency.  Musculoskeletal: Negative for myalgias.  Skin: Negative.   Neurological: Negative for headaches.  Endo/Heme/Allergies: Negative.   Psychiatric/Behavioral: Negative.     O: Vitals:   02/07/18 1859  Pulse: 67  SpO2: 96%     Physical Exam  Constitutional: He is oriented to person, place, and time. Vital signs are normal. He appears well-developed and well-nourished. He is active.  Non-toxic appearance. He does not have a sickly appearance.  HENT:  Head: Normocephalic.  Right Ear: Hearing, tympanic membrane, external ear and ear canal normal.  Left Ear: Hearing, tympanic membrane, external ear and ear canal normal.  Nose: Nose normal.  Mouth/Throat: Uvula is midline and oropharynx is clear and moist.  Neck: Normal range of motion. Neck supple.  Cardiovascular: Normal rate, regular rhythm, normal heart sounds and normal pulses.  Pulmonary/Chest: Effort normal and breath sounds normal.  Abdominal: Soft. Bowel sounds are normal.  Musculoskeletal: Normal range of motion.  Lymphadenopathy:       Head (right side): No submental and no submandibular adenopathy present.       Head (left side): No submental and no submandibular adenopathy present.    He has no cervical adenopathy.  Neurological: He is alert and oriented to person, place, and time. He  has normal strength. No cranial nerve deficit or sensory deficit. He displays a negative Romberg sign. GCS eye subscore is 4. GCS verbal subscore is 5. GCS motor subscore is 6.  Strength and balance intact Cognitive recall - WNL  Psychiatric: He has a normal mood and affect.  PHQ-9- negative  Vitals reviewed.    A: 1. Physical exam      P: Exam WNL no acute findings. Form filled out and returned to patient. Exam findings, diagnosis etiology and medication use and indications reviewed with patient. Follow- Up and discharge instructions provided. No emergent/urgent issues found on exam.  Patient verbalized understanding of information provided and agrees with plan of care (POC), all questions answered.  1. Physical exam

## 2018-02-08 DIAGNOSIS — M9901 Segmental and somatic dysfunction of cervical region: Secondary | ICD-10-CM | POA: Diagnosis not present

## 2018-02-08 DIAGNOSIS — M9903 Segmental and somatic dysfunction of lumbar region: Secondary | ICD-10-CM | POA: Diagnosis not present

## 2018-02-08 DIAGNOSIS — M9905 Segmental and somatic dysfunction of pelvic region: Secondary | ICD-10-CM | POA: Diagnosis not present

## 2018-02-08 DIAGNOSIS — M9902 Segmental and somatic dysfunction of thoracic region: Secondary | ICD-10-CM | POA: Diagnosis not present

## 2018-02-14 DIAGNOSIS — M9905 Segmental and somatic dysfunction of pelvic region: Secondary | ICD-10-CM | POA: Diagnosis not present

## 2018-02-14 DIAGNOSIS — M9901 Segmental and somatic dysfunction of cervical region: Secondary | ICD-10-CM | POA: Diagnosis not present

## 2018-02-14 DIAGNOSIS — M9902 Segmental and somatic dysfunction of thoracic region: Secondary | ICD-10-CM | POA: Diagnosis not present

## 2018-02-14 DIAGNOSIS — M9903 Segmental and somatic dysfunction of lumbar region: Secondary | ICD-10-CM | POA: Diagnosis not present

## 2018-02-16 DIAGNOSIS — M9903 Segmental and somatic dysfunction of lumbar region: Secondary | ICD-10-CM | POA: Diagnosis not present

## 2018-02-16 DIAGNOSIS — M9902 Segmental and somatic dysfunction of thoracic region: Secondary | ICD-10-CM | POA: Diagnosis not present

## 2018-02-16 DIAGNOSIS — M9905 Segmental and somatic dysfunction of pelvic region: Secondary | ICD-10-CM | POA: Diagnosis not present

## 2018-02-16 DIAGNOSIS — M9901 Segmental and somatic dysfunction of cervical region: Secondary | ICD-10-CM | POA: Diagnosis not present

## 2018-02-28 DIAGNOSIS — M9903 Segmental and somatic dysfunction of lumbar region: Secondary | ICD-10-CM | POA: Diagnosis not present

## 2018-02-28 DIAGNOSIS — M9901 Segmental and somatic dysfunction of cervical region: Secondary | ICD-10-CM | POA: Diagnosis not present

## 2018-02-28 DIAGNOSIS — M9905 Segmental and somatic dysfunction of pelvic region: Secondary | ICD-10-CM | POA: Diagnosis not present

## 2018-02-28 DIAGNOSIS — M9902 Segmental and somatic dysfunction of thoracic region: Secondary | ICD-10-CM | POA: Diagnosis not present

## 2018-03-04 DIAGNOSIS — M9905 Segmental and somatic dysfunction of pelvic region: Secondary | ICD-10-CM | POA: Diagnosis not present

## 2018-03-04 DIAGNOSIS — M9903 Segmental and somatic dysfunction of lumbar region: Secondary | ICD-10-CM | POA: Diagnosis not present

## 2018-03-04 DIAGNOSIS — M9902 Segmental and somatic dysfunction of thoracic region: Secondary | ICD-10-CM | POA: Diagnosis not present

## 2018-03-04 DIAGNOSIS — M9901 Segmental and somatic dysfunction of cervical region: Secondary | ICD-10-CM | POA: Diagnosis not present

## 2018-03-14 DIAGNOSIS — M9905 Segmental and somatic dysfunction of pelvic region: Secondary | ICD-10-CM | POA: Diagnosis not present

## 2018-03-14 DIAGNOSIS — M9903 Segmental and somatic dysfunction of lumbar region: Secondary | ICD-10-CM | POA: Diagnosis not present

## 2018-03-14 DIAGNOSIS — M9901 Segmental and somatic dysfunction of cervical region: Secondary | ICD-10-CM | POA: Diagnosis not present

## 2018-03-14 DIAGNOSIS — M9902 Segmental and somatic dysfunction of thoracic region: Secondary | ICD-10-CM | POA: Diagnosis not present

## 2018-03-21 DIAGNOSIS — M9905 Segmental and somatic dysfunction of pelvic region: Secondary | ICD-10-CM | POA: Diagnosis not present

## 2018-03-21 DIAGNOSIS — M9901 Segmental and somatic dysfunction of cervical region: Secondary | ICD-10-CM | POA: Diagnosis not present

## 2018-03-21 DIAGNOSIS — M9902 Segmental and somatic dysfunction of thoracic region: Secondary | ICD-10-CM | POA: Diagnosis not present

## 2018-03-21 DIAGNOSIS — M9903 Segmental and somatic dysfunction of lumbar region: Secondary | ICD-10-CM | POA: Diagnosis not present

## 2018-04-05 DIAGNOSIS — M9901 Segmental and somatic dysfunction of cervical region: Secondary | ICD-10-CM | POA: Diagnosis not present

## 2018-04-05 DIAGNOSIS — M9902 Segmental and somatic dysfunction of thoracic region: Secondary | ICD-10-CM | POA: Diagnosis not present

## 2018-04-05 DIAGNOSIS — M9903 Segmental and somatic dysfunction of lumbar region: Secondary | ICD-10-CM | POA: Diagnosis not present

## 2018-04-05 DIAGNOSIS — M9905 Segmental and somatic dysfunction of pelvic region: Secondary | ICD-10-CM | POA: Diagnosis not present

## 2018-05-06 DIAGNOSIS — M9902 Segmental and somatic dysfunction of thoracic region: Secondary | ICD-10-CM | POA: Diagnosis not present

## 2018-05-06 DIAGNOSIS — M9905 Segmental and somatic dysfunction of pelvic region: Secondary | ICD-10-CM | POA: Diagnosis not present

## 2018-05-06 DIAGNOSIS — M9903 Segmental and somatic dysfunction of lumbar region: Secondary | ICD-10-CM | POA: Diagnosis not present

## 2018-05-06 DIAGNOSIS — M9901 Segmental and somatic dysfunction of cervical region: Secondary | ICD-10-CM | POA: Diagnosis not present

## 2018-09-17 DIAGNOSIS — M9902 Segmental and somatic dysfunction of thoracic region: Secondary | ICD-10-CM | POA: Diagnosis not present

## 2018-09-17 DIAGNOSIS — M9901 Segmental and somatic dysfunction of cervical region: Secondary | ICD-10-CM | POA: Diagnosis not present

## 2018-09-17 DIAGNOSIS — M9905 Segmental and somatic dysfunction of pelvic region: Secondary | ICD-10-CM | POA: Diagnosis not present

## 2018-09-17 DIAGNOSIS — M9903 Segmental and somatic dysfunction of lumbar region: Secondary | ICD-10-CM | POA: Diagnosis not present

## 2018-09-23 DIAGNOSIS — M9905 Segmental and somatic dysfunction of pelvic region: Secondary | ICD-10-CM | POA: Diagnosis not present

## 2018-09-23 DIAGNOSIS — M9901 Segmental and somatic dysfunction of cervical region: Secondary | ICD-10-CM | POA: Diagnosis not present

## 2018-09-23 DIAGNOSIS — M9902 Segmental and somatic dysfunction of thoracic region: Secondary | ICD-10-CM | POA: Diagnosis not present

## 2018-09-23 DIAGNOSIS — M9903 Segmental and somatic dysfunction of lumbar region: Secondary | ICD-10-CM | POA: Diagnosis not present

## 2018-10-07 DIAGNOSIS — M9905 Segmental and somatic dysfunction of pelvic region: Secondary | ICD-10-CM | POA: Diagnosis not present

## 2018-10-07 DIAGNOSIS — M9903 Segmental and somatic dysfunction of lumbar region: Secondary | ICD-10-CM | POA: Diagnosis not present

## 2018-10-07 DIAGNOSIS — M9902 Segmental and somatic dysfunction of thoracic region: Secondary | ICD-10-CM | POA: Diagnosis not present

## 2018-10-07 DIAGNOSIS — M9901 Segmental and somatic dysfunction of cervical region: Secondary | ICD-10-CM | POA: Diagnosis not present

## 2019-03-09 DIAGNOSIS — H5213 Myopia, bilateral: Secondary | ICD-10-CM | POA: Diagnosis not present

## 2019-03-30 DIAGNOSIS — R7303 Prediabetes: Secondary | ICD-10-CM | POA: Diagnosis not present

## 2019-03-30 DIAGNOSIS — M25511 Pain in right shoulder: Secondary | ICD-10-CM | POA: Diagnosis not present

## 2019-03-30 DIAGNOSIS — Z0000 Encounter for general adult medical examination without abnormal findings: Secondary | ICD-10-CM | POA: Diagnosis not present

## 2019-03-30 DIAGNOSIS — E7800 Pure hypercholesterolemia, unspecified: Secondary | ICD-10-CM | POA: Diagnosis not present

## 2019-04-03 MED FILL — MELOXICAM 7.5 MG TABLET: 7.5 | 15 days supply | Qty: 30 | Fill #0

## 2019-04-07 DIAGNOSIS — Z125 Encounter for screening for malignant neoplasm of prostate: Secondary | ICD-10-CM | POA: Diagnosis not present

## 2019-04-07 DIAGNOSIS — E7800 Pure hypercholesterolemia, unspecified: Secondary | ICD-10-CM | POA: Diagnosis not present

## 2019-04-07 DIAGNOSIS — R7303 Prediabetes: Secondary | ICD-10-CM | POA: Diagnosis not present

## 2019-04-07 DIAGNOSIS — Z0000 Encounter for general adult medical examination without abnormal findings: Secondary | ICD-10-CM | POA: Diagnosis not present

## 2019-04-07 DIAGNOSIS — Z23 Encounter for immunization: Secondary | ICD-10-CM | POA: Diagnosis not present

## 2019-06-14 DIAGNOSIS — D2239 Melanocytic nevi of other parts of face: Secondary | ICD-10-CM | POA: Diagnosis not present

## 2019-06-14 DIAGNOSIS — D225 Melanocytic nevi of trunk: Secondary | ICD-10-CM | POA: Diagnosis not present

## 2019-06-14 DIAGNOSIS — B078 Other viral warts: Secondary | ICD-10-CM | POA: Diagnosis not present

## 2019-06-14 DIAGNOSIS — C44311 Basal cell carcinoma of skin of nose: Secondary | ICD-10-CM | POA: Diagnosis not present

## 2019-06-14 DIAGNOSIS — D1801 Hemangioma of skin and subcutaneous tissue: Secondary | ICD-10-CM | POA: Diagnosis not present

## 2019-06-14 DIAGNOSIS — D2261 Melanocytic nevi of right upper limb, including shoulder: Secondary | ICD-10-CM | POA: Diagnosis not present

## 2019-10-05 DIAGNOSIS — Z85828 Personal history of other malignant neoplasm of skin: Secondary | ICD-10-CM | POA: Diagnosis not present

## 2019-10-05 DIAGNOSIS — C44311 Basal cell carcinoma of skin of nose: Secondary | ICD-10-CM | POA: Diagnosis not present

## 2020-01-19 DIAGNOSIS — M9902 Segmental and somatic dysfunction of thoracic region: Secondary | ICD-10-CM | POA: Diagnosis not present

## 2020-01-19 DIAGNOSIS — M9905 Segmental and somatic dysfunction of pelvic region: Secondary | ICD-10-CM | POA: Diagnosis not present

## 2020-01-19 DIAGNOSIS — M9901 Segmental and somatic dysfunction of cervical region: Secondary | ICD-10-CM | POA: Diagnosis not present

## 2020-01-19 DIAGNOSIS — M9903 Segmental and somatic dysfunction of lumbar region: Secondary | ICD-10-CM | POA: Diagnosis not present

## 2020-01-29 DIAGNOSIS — M9905 Segmental and somatic dysfunction of pelvic region: Secondary | ICD-10-CM | POA: Diagnosis not present

## 2020-01-29 DIAGNOSIS — M9901 Segmental and somatic dysfunction of cervical region: Secondary | ICD-10-CM | POA: Diagnosis not present

## 2020-01-29 DIAGNOSIS — M9903 Segmental and somatic dysfunction of lumbar region: Secondary | ICD-10-CM | POA: Diagnosis not present

## 2020-01-29 DIAGNOSIS — M9902 Segmental and somatic dysfunction of thoracic region: Secondary | ICD-10-CM | POA: Diagnosis not present

## 2020-02-14 DIAGNOSIS — M9905 Segmental and somatic dysfunction of pelvic region: Secondary | ICD-10-CM | POA: Diagnosis not present

## 2020-02-14 DIAGNOSIS — M9903 Segmental and somatic dysfunction of lumbar region: Secondary | ICD-10-CM | POA: Diagnosis not present

## 2020-02-14 DIAGNOSIS — M9902 Segmental and somatic dysfunction of thoracic region: Secondary | ICD-10-CM | POA: Diagnosis not present

## 2020-02-14 DIAGNOSIS — M9901 Segmental and somatic dysfunction of cervical region: Secondary | ICD-10-CM | POA: Diagnosis not present

## 2020-03-06 DIAGNOSIS — M9905 Segmental and somatic dysfunction of pelvic region: Secondary | ICD-10-CM | POA: Diagnosis not present

## 2020-03-06 DIAGNOSIS — M9903 Segmental and somatic dysfunction of lumbar region: Secondary | ICD-10-CM | POA: Diagnosis not present

## 2020-03-06 DIAGNOSIS — M9901 Segmental and somatic dysfunction of cervical region: Secondary | ICD-10-CM | POA: Diagnosis not present

## 2020-03-06 DIAGNOSIS — M9902 Segmental and somatic dysfunction of thoracic region: Secondary | ICD-10-CM | POA: Diagnosis not present

## 2020-03-19 DIAGNOSIS — H524 Presbyopia: Secondary | ICD-10-CM | POA: Diagnosis not present

## 2020-03-19 DIAGNOSIS — H5213 Myopia, bilateral: Secondary | ICD-10-CM | POA: Diagnosis not present

## 2020-04-12 DIAGNOSIS — R7303 Prediabetes: Secondary | ICD-10-CM | POA: Diagnosis not present

## 2020-04-12 DIAGNOSIS — Z1211 Encounter for screening for malignant neoplasm of colon: Secondary | ICD-10-CM | POA: Diagnosis not present

## 2020-04-12 DIAGNOSIS — Z125 Encounter for screening for malignant neoplasm of prostate: Secondary | ICD-10-CM | POA: Diagnosis not present

## 2020-04-12 DIAGNOSIS — E7800 Pure hypercholesterolemia, unspecified: Secondary | ICD-10-CM | POA: Diagnosis not present

## 2020-04-12 DIAGNOSIS — J309 Allergic rhinitis, unspecified: Secondary | ICD-10-CM | POA: Diagnosis not present

## 2020-04-12 DIAGNOSIS — Z85828 Personal history of other malignant neoplasm of skin: Secondary | ICD-10-CM | POA: Diagnosis not present

## 2020-04-12 DIAGNOSIS — M545 Low back pain: Secondary | ICD-10-CM | POA: Diagnosis not present

## 2020-04-12 DIAGNOSIS — Z0000 Encounter for general adult medical examination without abnormal findings: Secondary | ICD-10-CM | POA: Diagnosis not present

## 2020-06-05 ENCOUNTER — Other Ambulatory Visit (HOSPITAL_COMMUNITY): Payer: Self-pay | Admitting: Gastroenterology

## 2020-06-05 DIAGNOSIS — Z1211 Encounter for screening for malignant neoplasm of colon: Secondary | ICD-10-CM | POA: Diagnosis not present

## 2020-06-17 DIAGNOSIS — B078 Other viral warts: Secondary | ICD-10-CM | POA: Diagnosis not present

## 2020-06-17 DIAGNOSIS — D2262 Melanocytic nevi of left upper limb, including shoulder: Secondary | ICD-10-CM | POA: Diagnosis not present

## 2020-06-17 DIAGNOSIS — D1801 Hemangioma of skin and subcutaneous tissue: Secondary | ICD-10-CM | POA: Diagnosis not present

## 2020-06-17 DIAGNOSIS — Z85828 Personal history of other malignant neoplasm of skin: Secondary | ICD-10-CM | POA: Diagnosis not present

## 2020-06-17 DIAGNOSIS — D2372 Other benign neoplasm of skin of left lower limb, including hip: Secondary | ICD-10-CM | POA: Diagnosis not present

## 2020-06-17 DIAGNOSIS — D225 Melanocytic nevi of trunk: Secondary | ICD-10-CM | POA: Diagnosis not present

## 2020-06-17 DIAGNOSIS — L814 Other melanin hyperpigmentation: Secondary | ICD-10-CM | POA: Diagnosis not present

## 2020-07-11 MED FILL — PEG-3350 SOLUTION: 420 | 1 days supply | Qty: 4000 | Fill #0

## 2020-07-23 DIAGNOSIS — Z1159 Encounter for screening for other viral diseases: Secondary | ICD-10-CM | POA: Diagnosis not present

## 2020-07-26 DIAGNOSIS — K635 Polyp of colon: Secondary | ICD-10-CM | POA: Diagnosis not present

## 2020-07-26 DIAGNOSIS — K5730 Diverticulosis of large intestine without perforation or abscess without bleeding: Secondary | ICD-10-CM | POA: Diagnosis not present

## 2020-07-26 DIAGNOSIS — Z1211 Encounter for screening for malignant neoplasm of colon: Secondary | ICD-10-CM | POA: Diagnosis not present

## 2020-07-30 ENCOUNTER — Ambulatory Visit: Payer: 59 | Attending: Internal Medicine

## 2020-07-30 DIAGNOSIS — Z23 Encounter for immunization: Secondary | ICD-10-CM

## 2020-07-30 NOTE — Progress Notes (Signed)
   Covid-19 Vaccination Clinic  Name:  Mitchell Melendez    MRN: [id number] DOB: Dec 30, 1970  07/30/2020  Mr. Mitchell Melendez was observed post Covid-19 immunization for 15 minutes without incident. He was provided with Vaccine Information Sheet and instruction to access the V-Safe system.   Mr. Mitchell Melendez was instructed to call 911 with any severe reactions post vaccine: [name] Kitchen Difficulty breathing  . Swelling of face and throat  . A fast heartbeat  . A bad rash all over body  . Dizziness and weakness   Immunizations Administered    Name Date Dose VIS Date Route   Pfizer COVID-19 Vaccine 07/30/2020  5:36 PM 0.3 mL 06/26/2020 Intramuscular   Manufacturer: [place]   Lot: [id number]   Industry: [id number]

## 2020-12-31 ENCOUNTER — Ambulatory Visit: Payer: 59 | Attending: Internal Medicine

## 2020-12-31 DIAGNOSIS — Z23 Encounter for immunization: Secondary | ICD-10-CM

## 2020-12-31 NOTE — Progress Notes (Signed)
   Covid-19 Vaccination Clinic  Name:  DANZIG MACGREGOR    MRN: [id number] DOB: April 13, 1971  12/31/2020  Mr. Kalmbach was observed post Covid-19 immunization for 15 minutes without incident. He was provided with Vaccine Information Sheet and instruction to access the V-Safe system.   Mr. Bihl was instructed to call 911 with any severe reactions post vaccine: [name] Kitchen Difficulty breathing  . Swelling of face and throat  . A fast heartbeat  . A bad rash all over body  . Dizziness and weakness   Immunizations Administered    Name Date Dose VIS Date Route   PFIZER Comrnaty([place]) Covid-19 Vaccine 12/31/2020  1:44 PM 0.3 mL 08/15/2020 Intramuscular   Manufacturer: [place]   Lot: [id number]   NDC: [phone]

## 2021-01-02 ENCOUNTER — Other Ambulatory Visit (HOSPITAL_BASED_OUTPATIENT_CLINIC_OR_DEPARTMENT_OTHER): Payer: Self-pay

## 2021-01-02 MED ORDER — PFIZER-BIONT COVID-19 VAC-TRIS 30 MCG/0.3ML IM SUSP
INTRAMUSCULAR | 0 refills | Status: DC
  Filled 2021-01-02: qty 0.3, 1d supply, fill #0

## 2021-01-03 ENCOUNTER — Other Ambulatory Visit (HOSPITAL_BASED_OUTPATIENT_CLINIC_OR_DEPARTMENT_OTHER): Payer: Self-pay

## 2021-01-22 ENCOUNTER — Other Ambulatory Visit (HOSPITAL_COMMUNITY): Payer: Self-pay | Admitting: Orthopedic Surgery

## 2021-01-22 ENCOUNTER — Other Ambulatory Visit: Payer: Self-pay | Admitting: Orthopedic Surgery

## 2021-01-22 DIAGNOSIS — M25572 Pain in left ankle and joints of left foot: Secondary | ICD-10-CM

## 2021-01-23 ENCOUNTER — Ambulatory Visit (HOSPITAL_COMMUNITY)
Admission: RE | Admit: 2021-01-23 | Discharge: 2021-01-23 | Disposition: A | Discharge status: Home / Self Care | Payer: 59 | Source: Ambulatory Visit | Attending: Orthopedic Surgery | Admitting: Orthopedic Surgery

## 2021-01-23 ENCOUNTER — Other Ambulatory Visit: Payer: Self-pay

## 2021-01-23 DIAGNOSIS — Z8739 Personal history of other diseases of the musculoskeletal system and connective tissue: Secondary | ICD-10-CM | POA: Diagnosis not present

## 2021-01-23 DIAGNOSIS — S86312A Strain of muscle(s) and tendon(s) of peroneal muscle group at lower leg level, left leg, initial encounter: Secondary | ICD-10-CM | POA: Diagnosis not present

## 2021-01-23 DIAGNOSIS — M25572 Pain in left ankle and joints of left foot: Secondary | ICD-10-CM | POA: Insufficient documentation

## 2021-01-23 DIAGNOSIS — S86012A Strain of left Achilles tendon, initial encounter: Secondary | ICD-10-CM | POA: Diagnosis not present

## 2021-01-27 DIAGNOSIS — S86012A Strain of left Achilles tendon, initial encounter: Secondary | ICD-10-CM | POA: Diagnosis not present

## 2021-02-04 ENCOUNTER — Other Ambulatory Visit (HOSPITAL_COMMUNITY): Payer: Self-pay

## 2021-02-04 DIAGNOSIS — G8918 Other acute postprocedural pain: Secondary | ICD-10-CM | POA: Diagnosis not present

## 2021-02-04 DIAGNOSIS — S86012A Strain of left Achilles tendon, initial encounter: Secondary | ICD-10-CM | POA: Diagnosis not present

## 2021-02-04 DIAGNOSIS — M66872 Spontaneous rupture of other tendons, left ankle and foot: Secondary | ICD-10-CM | POA: Diagnosis not present

## 2021-02-04 HISTORY — PX: ACHILLES TENDON REPAIR: SUR1153

## 2021-02-04 MED ORDER — OXYCODONE HCL 5 MG PO TABS
ORAL_TABLET | ORAL | 0 refills | Status: DC
  Filled 2021-02-04: qty 18, 3d supply, fill #0

## 2021-02-04 MED ORDER — ASPIRIN 81 MG PO TBEC
DELAYED_RELEASE_TABLET | ORAL | 0 refills | Status: DC
  Filled 2021-02-04: qty 30, 15d supply, fill #0

## 2021-03-17 ENCOUNTER — Encounter (HOSPITAL_BASED_OUTPATIENT_CLINIC_OR_DEPARTMENT_OTHER): Payer: Self-pay | Admitting: Physical Therapy

## 2021-03-17 ENCOUNTER — Other Ambulatory Visit: Payer: Self-pay

## 2021-03-17 ENCOUNTER — Ambulatory Visit (HOSPITAL_BASED_OUTPATIENT_CLINIC_OR_DEPARTMENT_OTHER): Payer: 59 | Attending: Student | Admitting: Physical Therapy

## 2021-03-17 DIAGNOSIS — R262 Difficulty in walking, not elsewhere classified: Secondary | ICD-10-CM | POA: Diagnosis not present

## 2021-03-17 DIAGNOSIS — M25572 Pain in left ankle and joints of left foot: Secondary | ICD-10-CM

## 2021-03-17 DIAGNOSIS — R600 Localized edema: Secondary | ICD-10-CM | POA: Diagnosis not present

## 2021-03-17 DIAGNOSIS — M6281 Muscle weakness (generalized): Secondary | ICD-10-CM | POA: Diagnosis not present

## 2021-03-17 DIAGNOSIS — M25672 Stiffness of left ankle, not elsewhere classified: Secondary | ICD-10-CM

## 2021-03-18 NOTE — Therapy (Signed)
[facility] [place], [place], [address] Phone: [phone]   Fax:  [phone]  Physical Therapy Evaluation  Patient Details  Name: Mitchell Melendez MRN: [id number] Date of Birth: November 17, 1970 Referring Provider (PT): Mechele Claude PA-C   Encounter Date: 03/17/2021   PT End of Session - 03/17/21 2145     Visit Number 1    Number of Visits 25    Date for PT Re-Evaluation 06/13/21    Authorization Type [facility] UMR    PT Start Time 2202    PT Stop Time 1610    PT Time Calculation (min) 55 min    Activity Tolerance Patient tolerated treatment well    Behavior During Therapy [facility] for tasks assessed/performed             Past Medical History:  Diagnosis Date   Seasonal allergies     Past Surgical History:  Procedure Laterality Date   ACHILLES TENDON REPAIR Left 02/04/2021   DENTAL SURGERY     VASECTOMY      There were no vitals filed for this visit.    Subjective Assessment - 03/17/21 1519     Subjective Ween from boot, rec by MD to start at 1 hr/day. I have been wearing compression sock some with boot. I can take a few shuffle steps without boot but thats about it. Back feels a litle irritated but not debilitating.    How long can you walk comfortably? a few steps without boot    Patient Stated Goals golf, hike, run    Currently in Pain? Yes    Pain Score 1     Pain Location Ankle    Pain Orientation Left    Pain Descriptors / Indicators Aching   weak   Aggravating Factors  weight bearing    Pain Relieving Factors rest                [facility] PT Assessment - 03/17/21 0001       Assessment   Medical Diagnosis s/p Lt achilles tendon repair    Referring Provider (PT) Mechele Claude PA-C    Onset Date/Surgical Date 02/04/21    Hand Dominance Right    Next MD Visit 8/8    Prior Therapy no      Precautions   Precautions None      Restrictions   Other Position/Activity Restrictions WBAT       Balance Screen   Has the patient fallen in the past 6 months No      [place] residence      Prior Function   Level of Independence Independent    Vocation Full time employment    Leisure golf, hike, run      Cognition   Overall Cognitive Status Within Functional Limits for tasks assessed      Observation/Other Assessments-Edema    Edema Circumferential   significant edema Lt ankle     Sensation   Additional Comments WFL      ROM / Strength   AROM / PROM / Strength AROM;PROM      AROM   AROM Assessment Site Ankle    Right/Left Ankle Left    Left Ankle Dorsiflexion -1      PROM   PROM Assessment Site Ankle    Right/Left Ankle Left    Left Ankle Dorsiflexion 2      Palpation   Palpation comment denied TTP  Objective measurements completed on examination: See above findings.       [facility] PT Treatment/Exercise - 03/17/21 0001       Exercises   Exercises Ankle      Modalities   Modalities Vasopneumatic      Vasopneumatic   Number Minutes Vasopneumatic  15 minutes    Vasopnuematic Location  Ankle    Vasopneumatic Pressure Low    Vasopneumatic Temperature  34      Manual Therapy   Manual Therapy Edema management    Edema Management ankle      Ankle Exercises: Stretches   Gastroc Stretch Limitations long sitting DF stretch      Ankle Exercises: Standing   Other Standing Ankle Exercises heel-toe & retro stepping    Other Standing Ankle Exercises weight shift lateral & a/p in wide tandem      Ankle Exercises: Seated   Other Seated Ankle Exercises PF red tband    Other Seated Ankle Exercises isometric inversion & eversion                    PT Education - 03/17/21 2145     Education Details anatomy of condition, POC, HEP, exercise form/rationale, bracing & boot wear    Person(s) Educated Patient    Methods Explanation;Demonstration;Tactile cues;Verbal  cues;Handout    Comprehension Verbalized understanding;Returned demonstration;Verbal cues required;Tactile cues required;Need further instruction              PT Short Term Goals - 03/18/21 0653       PT SHORT TERM GOAL #1   Title pt will be prepared with exercises, pain modulation techniques and supports for trip    Baseline will cont to educate as appropriate    Time 2    Period Weeks    Status New    Target Date 04/01/21      PT SHORT TERM GOAL #2   Title pt will demo heel toe gait with controlled knee extension    Baseline uncontrolled at eval    Time 4    Period Weeks    Status New    Target Date 04/18/21      PT SHORT TERM GOAL #3   Title pt will be independent in rice and mobility techniques throughout his days to control edema    Baseline will educate as appropriate    Time 4    Period Weeks    Status New    Target Date 04/18/21               PT Long Term Goals - 03/18/21 0655       PT LONG TERM GOAL #1   Title pt will tolerate torque on ankle with good stability to return to golf    Baseline unable at eval    Time 12    Period Weeks    Status New    Target Date 06/13/21      PT LONG TERM GOAL #2   Title pt will return to hiking with good ankle stability and awareness of postures/control    Baseline weakness and instability at eval    Time 12    Period Weeks    Status New    Target Date 06/13/21      PT LONG TERM GOAL #3   Title pt will demo good control and posture while performing light plyometric activities and running    Baseline unable at eval    Time 12  Period Weeks    Status New    Target Date 06/13/21      PT LONG TERM GOAL #4   Title bil LE strength to gross 5/5 for proximal stability for distal support    Baseline NT at eval    Time 12    Period Weeks    Status New    Target Date 06/13/21                    Plan - 03/17/21 1603     Clinical Impression Statement Pt presents to PT 6 wk s/p Lt achilles  repair. From his f/u this morning he was encouraged to begin weening from the boot. Is going on a cruise wiht options for hikes and snorkeling later this month- discussed importance of ice and compression sock to control edema as well as awareness of ankle/foot when ambulating. Will wear lace up ASO for extra support. Uncontrolled extension noted in stance phase of gait as expected with gastroc weakness. Significant edema noted in ankle with good healing of incision site. HEP began with ankle stability/control exercises in [place] and weight shifting with awareness of gait pattern in [facility]. Will cont to benefit from skilled PT to address deficits and meet long term functional goals.    Examination-Activity Limitations Locomotion Level;Bathing;Sit;Sleep;Squat;Stairs;Stand    Examination-Participation Restrictions Occupation;Community Activity    Stability/Clinical Decision Making Stable/Uncomplicated    Clinical Decision Making Low    Rehab Potential Good    PT Frequency 2x / week    PT Duration 12 weeks    PT Treatment/Interventions ADLs/Self Care Home Management;Aquatic Therapy;Cryotherapy;Electrical Stimulation;Gait training;Moist Heat;Stair training;Functional mobility training;Therapeutic activities;Therapeutic exercise;Balance training;Neuromuscular re-education;Manual techniques;Patient/family education;Passive range of motion;Dry needling;Taping    PT Next Visit Plan cont CKC and gait training, manual for edema & ROM    PT Home Exercise Plan [id number], walking backward, ice    Consulted and Agree with Plan of Care Patient             Patient will benefit from skilled therapeutic intervention in order to improve the following deficits and impairments:  Abnormal gait, Decreased range of motion, Difficulty walking, Increased muscle spasms, Decreased activity tolerance, Pain, Decreased balance, Hypomobility, Impaired flexibility, Improper body mechanics, Postural dysfunction, Increased edema,  Decreased strength, Decreased mobility  Visit Diagnosis: Muscle weakness (generalized) - Plan: PT plan of care cert/re-cert  Stiffness of left ankle, not elsewhere classified - Plan: PT plan of care cert/re-cert  Pain in left ankle and joints of left foot - Plan: PT plan of care cert/re-cert  Localized edema - Plan: PT plan of care cert/re-cert  Difficulty in walking, not elsewhere classified - Plan: PT plan of care cert/re-cert     Problem List There are no problems to display for this patient. Ulyssa Walthour C. Kimberlyann Hollar PT, DPT 03/18/21 7:01 AM   [facility] [place], [place], [address] Phone: [phone]   Fax:  [phone]  Name: Mitchell Melendez MRN: [id number] Date of Birth: [birth year]

## 2021-03-21 ENCOUNTER — Encounter (HOSPITAL_BASED_OUTPATIENT_CLINIC_OR_DEPARTMENT_OTHER): Payer: Self-pay | Admitting: Physical Therapy

## 2021-03-21 ENCOUNTER — Other Ambulatory Visit: Payer: Self-pay

## 2021-03-21 ENCOUNTER — Ambulatory Visit (HOSPITAL_BASED_OUTPATIENT_CLINIC_OR_DEPARTMENT_OTHER): Payer: 59 | Admitting: Physical Therapy

## 2021-03-21 DIAGNOSIS — M6281 Muscle weakness (generalized): Secondary | ICD-10-CM

## 2021-03-21 DIAGNOSIS — R262 Difficulty in walking, not elsewhere classified: Secondary | ICD-10-CM

## 2021-03-21 DIAGNOSIS — M25672 Stiffness of left ankle, not elsewhere classified: Secondary | ICD-10-CM

## 2021-03-21 DIAGNOSIS — M25572 Pain in left ankle and joints of left foot: Secondary | ICD-10-CM | POA: Diagnosis not present

## 2021-03-21 DIAGNOSIS — R600 Localized edema: Secondary | ICD-10-CM

## 2021-03-24 ENCOUNTER — Ambulatory Visit (HOSPITAL_BASED_OUTPATIENT_CLINIC_OR_DEPARTMENT_OTHER): Payer: 59 | Admitting: Physical Therapy

## 2021-03-24 ENCOUNTER — Encounter (HOSPITAL_BASED_OUTPATIENT_CLINIC_OR_DEPARTMENT_OTHER): Payer: Self-pay | Admitting: Physical Therapy

## 2021-03-24 ENCOUNTER — Other Ambulatory Visit: Payer: Self-pay

## 2021-03-24 DIAGNOSIS — R262 Difficulty in walking, not elsewhere classified: Secondary | ICD-10-CM | POA: Diagnosis not present

## 2021-03-24 DIAGNOSIS — M6281 Muscle weakness (generalized): Secondary | ICD-10-CM | POA: Diagnosis not present

## 2021-03-24 DIAGNOSIS — R600 Localized edema: Secondary | ICD-10-CM

## 2021-03-24 DIAGNOSIS — M25672 Stiffness of left ankle, not elsewhere classified: Secondary | ICD-10-CM

## 2021-03-24 DIAGNOSIS — M25572 Pain in left ankle and joints of left foot: Secondary | ICD-10-CM | POA: Diagnosis not present

## 2021-03-24 NOTE — Therapy (Signed)
[facility] [address] [place], [place], [address] Phone: [phone]   Fax:  [phone]  Physical Therapy Treatment  Patient Details  Name: Mitchell Melendez MRN: [id number] Date of Birth: 12/26/1970 Referring Provider (PT): Mechele Claude PA-C   Encounter Date: 03/21/2021   PT End of Session - 03/24/21 1303     Visit Number 2    Number of Visits 25    Date for PT Re-Evaluation 06/13/21    Authorization Type [facility]    PT Start Time 1219    PT Stop Time 1655    PT Time Calculation (min) 58 min    Activity Tolerance Patient tolerated treatment well    Behavior During Therapy [facility] for tasks assessed/performed             Past Medical History:  Diagnosis Date   Seasonal allergies     Past Surgical History:  Procedure Laterality Date   ACHILLES TENDON REPAIR Left 02/04/2021   DENTAL SURGERY     VASECTOMY      There were no vitals filed for this visit.   Subjective Assessment - 03/24/21 1302     Subjective Not debilitating pain.    Patient Stated Goals golf, hike, run    Currently in Pain? Yes    Pain Score 1     Pain Location Ankle    Pain Orientation Left    Pain Descriptors / Indicators Aching                               [facility] Adult PT Treatment/Exercise - 03/24/21 0001       Vasopneumatic   Number Minutes Vasopneumatic  15 minutes    Vasopnuematic Location  Ankle    Vasopneumatic Pressure Low    Vasopneumatic Temperature  34      Manual Therapy   Manual therapy comments ankle PROM    Edema Management ankle      Ankle Exercises: Standing   Rocker Board --   fitter circles standing   Other Standing Ankle Exercises weight shift- flat floor & airex    Other Standing Ankle Exercises hip hike 2" step- maint DF      Ankle Exercises: Sidelying   Other Sidelying Ankle Exercises hip burner: circles- arcs- abd      Ankle Exercises: Supine   Isometrics eversion/inversion/PF/DF     Other Supine Ankle Exercises TT alt leg ext in DF    Other Supine Ankle Exercises TT to knee ext- red tband around forefoot                      PT Short Term Goals - 03/18/21 0653       PT SHORT TERM GOAL #1   Title pt will be prepared with exercises, pain modulation techniques and supports for trip    Baseline will cont to educate as appropriate    Time 2    Period Weeks    Status New    Target Date 04/01/21      PT SHORT TERM GOAL #2   Title pt will demo heel toe gait with controlled knee extension    Baseline uncontrolled at eval    Time 4    Period Weeks    Status New    Target Date 04/18/21      PT SHORT TERM GOAL #3   Title pt will be independent in rice  and mobility techniques throughout his days to control edema    Baseline will educate as appropriate    Time 4    Period Weeks    Status New    Target Date 04/18/21               PT Long Term Goals - 03/18/21 0655       PT LONG TERM GOAL #1   Title pt will tolerate torque on ankle with good stability to return to golf    Baseline unable at eval    Time 12    Period Weeks    Status New    Target Date 06/13/21      PT LONG TERM GOAL #2   Title pt will return to hiking with good ankle stability and awareness of postures/control    Baseline weakness and instability at eval    Time 12    Period Weeks    Status New    Target Date 06/13/21      PT LONG TERM GOAL #3   Title pt will demo good control and posture while performing light plyometric activities and running    Baseline unable at eval    Time 12    Period Weeks    Status New    Target Date 06/13/21      PT LONG TERM GOAL #4   Title bil LE strength to gross 5/5 for proximal stability for distal support    Baseline NT at eval    Time 12    Period Weeks    Status New    Target Date 06/13/21                   Plan - 03/24/21 1303     Clinical Impression Statement Improving DF ROM to 2 deg. Notable flexibility in  forefoot. Instability due to expected weakness post op. Added core and hip strengthening for proximal stability.    PT Treatment/Interventions ADLs/Self Care Home Management;Aquatic Therapy;Cryotherapy;Electrical Stimulation;Gait training;Moist Heat;Stair training;Functional mobility training;Therapeutic activities;Therapeutic exercise;Balance training;Neuromuscular re-education;Manual techniques;Patient/family education;Passive range of motion;Dry needling;Taping    PT Next Visit Plan cont CKC & proximal stability    PT Home Exercise Plan [id number], walking backward, ice    Consulted and Agree with Plan of Care Patient             Patient will benefit from skilled therapeutic intervention in order to improve the following deficits and impairments:  Abnormal gait, Decreased range of motion, Difficulty walking, Increased muscle spasms, Decreased activity tolerance, Pain, Decreased balance, Hypomobility, Impaired flexibility, Improper body mechanics, Postural dysfunction, Increased edema, Decreased strength, Decreased mobility  Visit Diagnosis: Muscle weakness (generalized)  Stiffness of left ankle, not elsewhere classified  Pain in left ankle and joints of left foot  Localized edema  Difficulty in walking, not elsewhere classified     Problem List There are no problems to display for this patient.  Knolan Simien C. Indonesia Mckeough PT, DPT 03/24/21 1:04 PM   [facility] [address] [place], [place], [address] Phone: [phone]   Fax:  [phone]  Name: Mitchell Melendez MRN: [id number] Date of Birth: [birth year]

## 2021-03-24 NOTE — Therapy (Signed)
[facility] [place], [place], [address] Phone: [phone]   Fax:  [phone]  Physical Therapy Treatment  Patient Details  Name: Mitchell Melendez MRN: [id number] Date of Birth: [birth year] Referring Provider (PT): Mechele Claude PA-C   Encounter Date: 03/24/2021   PT End of Session - 03/24/21 1736     Visit Number 3    Number of Visits 25    Date for PT Re-Evaluation 06/13/21    Authorization Type [facility]    PT Start Time 1645    PT Stop Time 1740    PT Time Calculation (min) 55 min    Activity Tolerance Patient tolerated treatment well    Behavior During Therapy [facility] for tasks assessed/performed             Past Medical History:  Diagnosis Date   Seasonal allergies     Past Surgical History:  Procedure Laterality Date   ACHILLES TENDON REPAIR Left 02/04/2021   DENTAL SURGERY     VASECTOMY      There were no vitals filed for this visit.   Subjective Assessment - 03/24/21 1646     Subjective A little more swollen today. I was on it quite a bit at home this weekend. elevated on Sunday, did not wear boot.    Currently in Pain? No/denies                [facility] PT Assessment - 03/24/21 0001       PROM   Left Ankle Dorsiflexion 2                           [facility] Adult PT Treatment/Exercise - 03/24/21 1704       Vasopneumatic   Number Minutes Vasopneumatic  15 minutes    Vasopnuematic Location  Ankle    Vasopneumatic Pressure Low    Vasopneumatic Temperature  34      Manual Therapy   Manual Therapy Joint mobilization    Manual therapy comments DF to 4 deg following mobs    Edema Management Lt ankle elevated    Joint Mobilization distal tib AP in forward lunge      Ankle Exercises: Supine   T-Band SLR- neutral & ER    Other Supine Ankle Exercises PT applied isometrics & resistance through full range  PF/DF      Ankle Exercises: Sidelying   Other Sidelying Ankle  Exercises knee to chest- ext press red tband    Other Sidelying Ankle Exercises abd+hip flexion; hip adduction      Ankle Exercises: Standing   SLS 10s holds, alternating sides    Other Standing Ankle Exercises tandem with head turns    Other Standing Ankle Exercises single leg lateral step down 4" step                      PT Short Term Goals - 03/18/21 9767       PT SHORT TERM GOAL #1   Title pt will be prepared with exercises, pain modulation techniques and supports for trip    Baseline will cont to educate as appropriate    Time 2    Period Weeks    Status New    Target Date 04/01/21      PT SHORT TERM GOAL #2   Title pt will demo heel toe gait with controlled knee extension    Baseline uncontrolled at eval  Time 4    Period Weeks    Status New    Target Date 04/18/21      PT SHORT TERM GOAL #3   Title pt will be independent in rice and mobility techniques throughout his days to control edema    Baseline will educate as appropriate    Time 4    Period Weeks    Status New    Target Date 04/18/21               PT Long Term Goals - 03/18/21 0655       PT LONG TERM GOAL #1   Title pt will tolerate torque on ankle with good stability to return to golf    Baseline unable at eval    Time 12    Period Weeks    Status New    Target Date 06/13/21      PT LONG TERM GOAL #2   Title pt will return to hiking with good ankle stability and awareness of postures/control    Baseline weakness and instability at eval    Time 12    Period Weeks    Status New    Target Date 06/13/21      PT LONG TERM GOAL #3   Title pt will demo good control and posture while performing light plyometric activities and running    Baseline unable at eval    Time 12    Period Weeks    Status New    Target Date 06/13/21      PT LONG TERM GOAL #4   Title bil LE strength to gross 5/5 for proximal stability for distal support    Baseline NT at eval    Time 12    Period  Weeks    Status New    Target Date 06/13/21                   Plan - 03/24/21 1736     Clinical Impression Statement DF ROM improved following mobilization without complaints of impingement at end range.    PT Treatment/Interventions ADLs/Self Care Home Management;Aquatic Therapy;Cryotherapy;Electrical Stimulation;Gait training;Moist Heat;Stair training;Functional mobility training;Therapeutic activities;Therapeutic exercise;Balance training;Neuromuscular re-education;Manual techniques;Patient/family education;Passive range of motion;Dry needling;Taping    PT Next Visit Plan DF mobs & stretching, IASTM to gastroc    PT Home Exercise Plan [id number], walking backward, ice    Consulted and Agree with Plan of Care Patient             Patient will benefit from skilled therapeutic intervention in order to improve the following deficits and impairments:  Abnormal gait, Decreased range of motion, Difficulty walking, Increased muscle spasms, Decreased activity tolerance, Pain, Decreased balance, Hypomobility, Impaired flexibility, Improper body mechanics, Postural dysfunction, Increased edema, Decreased strength, Decreased mobility  Visit Diagnosis: Muscle weakness (generalized)  Stiffness of left ankle, not elsewhere classified  Pain in left ankle and joints of left foot  Localized edema  Difficulty in walking, not elsewhere classified     Problem List There are no problems to display for this patient.  Vilma Will C. Josette Shimabukuro PT, DPT 03/24/21 8:30 PM   [facility] [address] [place], [place], [address] Phone: [phone]   Fax:  [phone]  Name: Mitchell Melendez MRN: [id number] Date of Birth: [birth year]

## 2021-03-27 ENCOUNTER — Encounter (HOSPITAL_BASED_OUTPATIENT_CLINIC_OR_DEPARTMENT_OTHER): Payer: Self-pay | Admitting: Physical Therapy

## 2021-03-27 ENCOUNTER — Other Ambulatory Visit: Payer: Self-pay

## 2021-03-27 ENCOUNTER — Ambulatory Visit (HOSPITAL_BASED_OUTPATIENT_CLINIC_OR_DEPARTMENT_OTHER): Payer: 59 | Admitting: Physical Therapy

## 2021-03-27 DIAGNOSIS — R600 Localized edema: Secondary | ICD-10-CM | POA: Diagnosis not present

## 2021-03-27 DIAGNOSIS — M25572 Pain in left ankle and joints of left foot: Secondary | ICD-10-CM | POA: Diagnosis not present

## 2021-03-27 DIAGNOSIS — M25672 Stiffness of left ankle, not elsewhere classified: Secondary | ICD-10-CM

## 2021-03-27 DIAGNOSIS — M6281 Muscle weakness (generalized): Secondary | ICD-10-CM | POA: Diagnosis not present

## 2021-03-27 DIAGNOSIS — R262 Difficulty in walking, not elsewhere classified: Secondary | ICD-10-CM | POA: Diagnosis not present

## 2021-03-27 NOTE — Therapy (Signed)
[place], [place], [address] Phone: [phone]   Fax:  [phone]  Physical Therapy Treatment  Patient Details  Name: Mitchell Melendez MRN: [id number] Date of Birth: 04-Dec-1970 Referring Provider (PT): Mechele Claude PA-C   Encounter Date: 03/27/2021   PT End of Session - 03/27/21 1149     Visit Number 4    Number of Visits 25    Date for PT Re-Evaluation 06/13/21    Authorization Type [facility]    PT Start Time 0350    PT Stop Time 1229    PT Time Calculation (min) 41 min    Activity Tolerance Patient tolerated treatment well    Behavior During Therapy [facility] for tasks assessed/performed             Past Medical History:  Diagnosis Date   Seasonal allergies     Past Surgical History:  Procedure Laterality Date   ACHILLES TENDON REPAIR Left 02/04/2021   DENTAL SURGERY     VASECTOMY      There were no vitals filed for this visit.   Subjective Assessment - 03/27/21 1148     Subjective trying to go more without the boot, a lot of swelling. no pain but still not as strong.    Patient Stated Goals golf, hike, run    Currently in Pain? No/denies                [facility] PT Assessment - 03/27/21 0001       AROM   Left Ankle Dorsiflexion 3      PROM   Left Ankle Dorsiflexion 5                           [facility] Adult PT Treatment/Exercise - 03/27/21 0001       Manual Therapy   Manual therapy comments passive DF stretching    Edema Management Lt ankle elevated      Ankle Exercises: Seated   Other Seated Ankle Exercises towel scrunches, toe yoga      Ankle Exercises: Standing   Other Standing Ankle Exercises tandem stance & walking fwd/back; fitter control in standing, heel raises short sets, side stepping over hurtdle                      PT Short Term Goals - 03/18/21 0653       PT SHORT TERM GOAL #1   Title pt will be prepared with exercises,  pain modulation techniques and supports for trip    Baseline will cont to educate as appropriate    Time 2    Period Weeks    Status New    Target Date 04/01/21      PT SHORT TERM GOAL #2   Title pt will demo heel toe gait with controlled knee extension    Baseline uncontrolled at eval    Time 4    Period Weeks    Status New    Target Date 04/18/21      PT SHORT TERM GOAL #3   Title pt will be independent in rice and mobility techniques throughout his days to control edema    Baseline will educate as appropriate    Time 4    Period Weeks    Status New    Target Date 04/18/21               PT Long  Term Goals - 03/18/21 0655       PT LONG TERM GOAL #1   Title pt will tolerate torque on ankle with good stability to return to golf    Baseline unable at eval    Time 12    Period Weeks    Status New    Target Date 06/13/21      PT LONG TERM GOAL #2   Title pt will return to hiking with good ankle stability and awareness of postures/control    Baseline weakness and instability at eval    Time 12    Period Weeks    Status New    Target Date 06/13/21      PT LONG TERM GOAL #3   Title pt will demo good control and posture while performing light plyometric activities and running    Baseline unable at eval    Time 12    Period Weeks    Status New    Target Date 06/13/21      PT LONG TERM GOAL #4   Title bil LE strength to gross 5/5 for proximal stability for distal support    Baseline NT at eval    Time 12    Period Weeks    Status New    Target Date 06/13/21                   Plan - 03/27/21 1307     Clinical Impression Statement ROM improving and maintining arch in foot. difficulty controlling pressure into eversion/DF on fitter board.    PT Treatment/Interventions ADLs/Self Care Home Management;Aquatic Therapy;Cryotherapy;Electrical Stimulation;Gait training;Moist Heat;Stair training;Functional mobility training;Therapeutic activities;Therapeutic  exercise;Balance training;Neuromuscular re-education;Manual techniques;Patient/family education;Passive range of motion;Dry needling;Taping    PT Next Visit Plan DF mobs & stretching, IASTM to gastroc    PT Home Exercise Plan [id number], walking backward, ice    Consulted and Agree with Plan of Care Patient             Patient will benefit from skilled therapeutic intervention in order to improve the following deficits and impairments:  Abnormal gait, Decreased range of motion, Difficulty walking, Increased muscle spasms, Decreased activity tolerance, Pain, Decreased balance, Hypomobility, Impaired flexibility, Improper body mechanics, Postural dysfunction, Increased edema, Decreased strength, Decreased mobility  Visit Diagnosis: Muscle weakness (generalized)  Stiffness of left ankle, not elsewhere classified  Pain in left ankle and joints of left foot  Localized edema  Difficulty in walking, not elsewhere classified     Problem List There are no problems to display for this patient. Jarrid Lienhard C. Pretty Weltman PT, DPT 03/27/21 4:26 PM   [facility] [address] [place], [place], [address] Phone: [phone]   Fax:  [phone]  Name: Mitchell Melendez MRN: [id number] Date of Birth: 16-Nov-1970

## 2021-03-31 ENCOUNTER — Ambulatory Visit (HOSPITAL_BASED_OUTPATIENT_CLINIC_OR_DEPARTMENT_OTHER): Payer: 59 | Admitting: Physical Therapy

## 2021-03-31 ENCOUNTER — Other Ambulatory Visit: Payer: Self-pay

## 2021-03-31 ENCOUNTER — Encounter (HOSPITAL_BASED_OUTPATIENT_CLINIC_OR_DEPARTMENT_OTHER): Payer: Self-pay | Admitting: Physical Therapy

## 2021-03-31 DIAGNOSIS — R600 Localized edema: Secondary | ICD-10-CM

## 2021-03-31 DIAGNOSIS — M25572 Pain in left ankle and joints of left foot: Secondary | ICD-10-CM | POA: Diagnosis not present

## 2021-03-31 DIAGNOSIS — R262 Difficulty in walking, not elsewhere classified: Secondary | ICD-10-CM

## 2021-03-31 DIAGNOSIS — M6281 Muscle weakness (generalized): Secondary | ICD-10-CM

## 2021-03-31 DIAGNOSIS — M25672 Stiffness of left ankle, not elsewhere classified: Secondary | ICD-10-CM

## 2021-03-31 NOTE — Therapy (Signed)
[facility] [address] [place], [place], [address] Phone: [phone]   Fax:  [phone]  Physical Therapy Treatment  Patient Details  Name: Mitchell Melendez MRN: [id number] Date of Birth: 1971/09/03 Referring Provider (PT): Mechele Claude PA-C   Encounter Date: 03/31/2021   PT End of Session - 03/31/21 1223     Visit Number 5    Number of Visits 25    Date for PT Re-Evaluation 06/13/21    Authorization Type [facility]    PT Start Time [id number]    PT Stop Time 1233    PT Time Calculation (min) 48 min    Activity Tolerance Patient tolerated treatment well    Behavior During Therapy [facility] for tasks assessed/performed             Past Medical History:  Diagnosis Date   Seasonal allergies     Past Surgical History:  Procedure Laterality Date   ACHILLES TENDON REPAIR Left 02/04/2021   DENTAL SURGERY     VASECTOMY      There were no vitals filed for this visit.   Subjective Assessment - 03/31/21 1147     Subjective I aggrivated it a little bit yesterday so am a little stiff with mild pinch on medial side of achilles. Squatted down last night to pet dog and felt the pool. Walked about 1.5 miles in [place] and felt good. Out of boot since last Thurs.    Patient Stated Goals golf, hike, run    Currently in Pain? Yes    Pain Score --   mild   Pain Location Heel    Pain Orientation Left    Pain Descriptors / Indicators --   pinching                              [facility] Adult PT Treatment/Exercise - 03/31/21 0001       Modalities   Modalities Cryotherapy      Cryotherapy   Number Minutes Cryotherapy 10 Minutes    Cryotherapy Location Ankle    Type of Cryotherapy Ice pack      Manual Therapy   Edema Management Lt ankle elevated      Ankle Exercises: Standing   SLS with opp fwd/back tap    Other Standing Ankle Exercises wall squats, walking all directions with mirror                       PT Short Term Goals - 03/18/21 0653       PT SHORT TERM GOAL #1   Title pt will be prepared with exercises, pain modulation techniques and supports for trip    Baseline will cont to educate as appropriate    Time 2    Period Weeks    Status New    Target Date 04/01/21      PT SHORT TERM GOAL #2   Title pt will demo heel toe gait with controlled knee extension    Baseline uncontrolled at eval    Time 4    Period Weeks    Status New    Target Date 04/18/21      PT SHORT TERM GOAL #3   Title pt will be independent in rice and mobility techniques throughout his days to control edema    Baseline will educate as appropriate    Time 4    Period Weeks  Status New    Target Date 04/18/21               PT Long Term Goals - 03/18/21 0655       PT LONG TERM GOAL #1   Title pt will tolerate torque on ankle with good stability to return to golf    Baseline unable at eval    Time 12    Period Weeks    Status New    Target Date 06/13/21      PT LONG TERM GOAL #2   Title pt will return to hiking with good ankle stability and awareness of postures/control    Baseline weakness and instability at eval    Time 12    Period Weeks    Status New    Target Date 06/13/21      PT LONG TERM GOAL #3   Title pt will demo good control and posture while performing light plyometric activities and running    Baseline unable at eval    Time 12    Period Weeks    Status New    Target Date 06/13/21      PT LONG TERM GOAL #4   Title bil LE strength to gross 5/5 for proximal stability for distal support    Baseline NT at eval    Time 12    Period Weeks    Status New    Target Date 06/13/21                   Plan - 03/31/21 1701     Clinical Impression Statement Pt is able to demo gait without antalgic pattern but does have uncontrolled knee extension in stance phase.    PT Treatment/Interventions ADLs/Self Care Home Management;Aquatic  Therapy;Cryotherapy;Electrical Stimulation;Gait training;Moist Heat;Stair training;Functional mobility training;Therapeutic activities;Therapeutic exercise;Balance training;Neuromuscular re-education;Manual techniques;Patient/family education;Passive range of motion;Dry needling;Taping    PT Next Visit Plan eccentric training, prep for vacation    PT Home Exercise Plan [id number], walking backward, ice    Consulted and Agree with Plan of Care Patient             Patient will benefit from skilled therapeutic intervention in order to improve the following deficits and impairments:  Abnormal gait, Decreased range of motion, Difficulty walking, Increased muscle spasms, Decreased activity tolerance, Pain, Decreased balance, Hypomobility, Impaired flexibility, Improper body mechanics, Postural dysfunction, Increased edema, Decreased strength, Decreased mobility  Visit Diagnosis: Muscle weakness (generalized)  Stiffness of left ankle, not elsewhere classified  Pain in left ankle and joints of left foot  Localized edema  Difficulty in walking, not elsewhere classified     Problem List There are no problems to display for this patient. Hitoshi Werts C. Karrissa Parchment PT, DPT 03/31/21 5:03 PM  [facility] [address] [place], [place], [address] Phone: [phone]   Fax:  [phone]  Name: Mitchell Melendez MRN: [id number] Date of Birth: Dec 31, 1970

## 2021-04-02 ENCOUNTER — Other Ambulatory Visit: Payer: Self-pay

## 2021-04-02 ENCOUNTER — Encounter (HOSPITAL_BASED_OUTPATIENT_CLINIC_OR_DEPARTMENT_OTHER): Payer: Self-pay | Admitting: Physical Therapy

## 2021-04-02 ENCOUNTER — Ambulatory Visit (HOSPITAL_BASED_OUTPATIENT_CLINIC_OR_DEPARTMENT_OTHER): Payer: 59 | Admitting: Physical Therapy

## 2021-04-02 DIAGNOSIS — M25672 Stiffness of left ankle, not elsewhere classified: Secondary | ICD-10-CM

## 2021-04-02 DIAGNOSIS — R600 Localized edema: Secondary | ICD-10-CM | POA: Diagnosis not present

## 2021-04-02 DIAGNOSIS — M25572 Pain in left ankle and joints of left foot: Secondary | ICD-10-CM | POA: Diagnosis not present

## 2021-04-02 DIAGNOSIS — R262 Difficulty in walking, not elsewhere classified: Secondary | ICD-10-CM | POA: Diagnosis not present

## 2021-04-02 DIAGNOSIS — M6281 Muscle weakness (generalized): Secondary | ICD-10-CM

## 2021-04-02 NOTE — Therapy (Signed)
[facility] [address] [place], [place], [address] Phone: [phone]   Fax:  [phone]  Physical Therapy Treatment  Patient Details  Name: Mitchell Melendez MRN: [id number] Date of Birth: February 28, 1971 Referring Provider (PT): Mechele Claude PA-C   Encounter Date: 04/02/2021   PT End of Session - 04/02/21 1432     Visit Number 6    Number of Visits 25    Date for PT Re-Evaluation 06/13/21    Authorization Type [facility]    PT Start Time [id number]    PT Stop Time 1511    PT Time Calculation (min) 40 min    Activity Tolerance Patient tolerated treatment well    Behavior During Therapy [facility] for tasks assessed/performed             Past Medical History:  Diagnosis Date   Seasonal allergies     Past Surgical History:  Procedure Laterality Date   ACHILLES TENDON REPAIR Left 02/04/2021   DENTAL SURGERY     VASECTOMY      There were no vitals filed for this visit.   Subjective Assessment - 04/02/21 1432     Subjective No pain, feels tight with large steps.    Patient Stated Goals golf, hike, run    Currently in Pain? No/denies                               [facility] Adult PT Treatment/Exercise - 04/02/21 0001       Manual Therapy   Manual therapy comments roller gastroc    Edema Management Lt ankle elevated      Ankle Exercises: Supine   Isometrics eversion, inversion resist by PT      Ankle Exercises: Standing   SLS with mini squats controling knee ext    Other Standing Ankle Exercises fwd step with push up elevation; fwd & retro tandem    Other Standing Ankle Exercises fwd & lat step ups      Ankle Exercises: Stretches   Other Stretch DF mobs on step                      PT Short Term Goals - 03/18/21 [id number]       PT SHORT TERM GOAL #1   Title pt will be prepared with exercises, pain modulation techniques and supports for trip    Baseline will cont to educate as appropriate     Time 2    Period Weeks    Status New    Target Date 04/01/21      PT SHORT TERM GOAL #2   Title pt will demo heel toe gait with controlled knee extension    Baseline uncontrolled at eval    Time 4    Period Weeks    Status New    Target Date 04/18/21      PT SHORT TERM GOAL #3   Title pt will be independent in rice and mobility techniques throughout his days to control edema    Baseline will educate as appropriate    Time 4    Period Weeks    Status New    Target Date 04/18/21               PT Long Term Goals - 03/18/21 0655       PT LONG TERM GOAL #1   Title pt will tolerate torque on ankle  with good stability to return to golf    Baseline unable at eval    Time 12    Period Weeks    Status New    Target Date 06/13/21      PT LONG TERM GOAL #2   Title pt will return to hiking with good ankle stability and awareness of postures/control    Baseline weakness and instability at eval    Time 12    Period Weeks    Status New    Target Date 06/13/21      PT LONG TERM GOAL #3   Title pt will demo good control and posture while performing light plyometric activities and running    Baseline unable at eval    Time 12    Period Weeks    Status New    Target Date 06/13/21      PT LONG TERM GOAL #4   Title bil LE strength to gross 5/5 for proximal stability for distal support    Baseline NT at eval    Time 12    Period Weeks    Status New    Target Date 06/13/21                   Plan - 04/02/21 1515     Clinical Impression Statement reprinted HEP with marks on the most important to focus on to maintain mobility, flexibility and stability while on vacation. notable lateral instability with h/o lateral gastroc strains. will progress as appropriate when he returns.    PT Treatment/Interventions ADLs/Self Care Home Management;Aquatic Therapy;Cryotherapy;Electrical Stimulation;Gait training;Moist Heat;Stair training;Functional mobility training;Therapeutic  activities;Therapeutic exercise;Balance training;Neuromuscular re-education;Manual techniques;Patient/family education;Passive range of motion;Dry needling;Taping    PT Next Visit Plan re-evaluate post vacation    PT Home Exercise Plan [id number], walking backward, ice    Consulted and Agree with Plan of Care Patient             Patient will benefit from skilled therapeutic intervention in order to improve the following deficits and impairments:  Abnormal gait, Decreased range of motion, Difficulty walking, Increased muscle spasms, Decreased activity tolerance, Pain, Decreased balance, Hypomobility, Impaired flexibility, Improper body mechanics, Postural dysfunction, Increased edema, Decreased strength, Decreased mobility  Visit Diagnosis: Muscle weakness (generalized)  Stiffness of left ankle, not elsewhere classified  Pain in left ankle and joints of left foot  Localized edema  Difficulty in walking, not elsewhere classified     Problem List There are no problems to display for this patient.   Scherry Laverne C. Piers Baade PT, DPT 04/02/21 3:17 PM   [facility] [address] [place], [place], [address] Phone: [phone]   Fax:  [phone]  Name: Mitchell Melendez MRN: [id number] Date of Birth: 09-02-1971

## 2021-04-15 ENCOUNTER — Ambulatory Visit (HOSPITAL_BASED_OUTPATIENT_CLINIC_OR_DEPARTMENT_OTHER): Payer: 59 | Attending: Student | Admitting: Physical Therapy

## 2021-04-15 ENCOUNTER — Other Ambulatory Visit: Payer: Self-pay

## 2021-04-15 ENCOUNTER — Encounter (HOSPITAL_BASED_OUTPATIENT_CLINIC_OR_DEPARTMENT_OTHER): Payer: Self-pay | Admitting: Physical Therapy

## 2021-04-15 DIAGNOSIS — M6281 Muscle weakness (generalized): Secondary | ICD-10-CM | POA: Insufficient documentation

## 2021-04-15 DIAGNOSIS — R600 Localized edema: Secondary | ICD-10-CM | POA: Diagnosis not present

## 2021-04-15 DIAGNOSIS — M25572 Pain in left ankle and joints of left foot: Secondary | ICD-10-CM | POA: Diagnosis not present

## 2021-04-15 DIAGNOSIS — R262 Difficulty in walking, not elsewhere classified: Secondary | ICD-10-CM | POA: Insufficient documentation

## 2021-04-15 DIAGNOSIS — M25672 Stiffness of left ankle, not elsewhere classified: Secondary | ICD-10-CM | POA: Insufficient documentation

## 2021-04-15 NOTE — Therapy (Signed)
[facility] [place], [place], [address] Phone: [phone]   Fax:  [phone]  Physical Therapy Treatment  Patient Details  Name: Mitchell Melendez MRN: [id number] Date of Birth: Jun 22, 1971 Referring Provider (PT): Mechele Claude PA-C   Encounter Date: 04/15/2021   PT End of Session - 04/15/21 2024     Visit Number 7    Number of Visits 25    Date for PT Re-Evaluation 06/13/21    Authorization Type [facility]    PT Start Time [id number]    PT Stop Time 1230    PT Time Calculation (min) 41 min    Activity Tolerance Patient tolerated treatment well    Behavior During Therapy [facility] for tasks assessed/performed             Past Medical History:  Diagnosis Date   Seasonal allergies     Past Surgical History:  Procedure Laterality Date   ACHILLES TENDON REPAIR Left 02/04/2021   DENTAL SURGERY     VASECTOMY      There were no vitals filed for this visit.   Subjective Assessment - 04/15/21 1148     Subjective I exhausted it but did not re-injur it. Was able to do a lot of walks and swim. I was able to ice at the end of every day and wore ASO.    How long can you walk comfortably? a few steps without boot    Patient Stated Goals golf, hike, run    Currently in Pain? No/denies    Aggravating Factors  fatigue    Pain Relieving Factors ice                [facility] PT Assessment - 04/15/21 0001       Assessment   Medical Diagnosis s/p Lt achilles tendon repair    Referring Provider (PT) Mechele Claude PA-C      Sensation   Additional Comments WFL      ROM / Strength   AROM / PROM / Strength Strength      AROM   Left Ankle Dorsiflexion 0      PROM   Left Ankle Dorsiflexion 4      Strength   Strength Assessment Site Ankle;Hip    Right/Left Hip Left    Left Hip Extension 4+/5    Left Hip ABduction 4/5    Right/Left Ankle Left    Left Ankle Plantar Flexion --   unable to perform SL heel raise   Left  Ankle Inversion 4/5                           [facility] Adult PT Treatment/Exercise - 04/15/21 0001       Exercises   Exercises Ankle (P)       Manual Therapy   Manual Therapy Soft tissue mobilization (P)     Joint Mobilization end range DF AP gr 4 (P)     Soft tissue mobilization achilles stretching with calcaneal eversion mob; IASTM ant tib & peroneals (P)                     PT Education - 04/15/21 2024     Education Details goals, progress, POC    Person(s) Educated Patient    Methods Explanation    Comprehension Verbalized understanding;Need further instruction              PT Short Term Goals -  04/15/21 2027       PT SHORT TERM GOAL #1   Title pt will be prepared with exercises, pain modulation techniques and supports for trip    Status Achieved      PT SHORT TERM GOAL #2   Title pt will demo heel toe gait with controlled knee extension    Status Achieved      PT SHORT TERM GOAL #3   Title pt will be independent in rice and mobility techniques throughout his days to control edema    Status Achieved               PT Long Term Goals - 03/18/21 0655       PT LONG TERM GOAL #1   Title pt will tolerate torque on ankle with good stability to return to golf    Baseline unable at eval    Time 12    Period Weeks    Status New    Target Date 06/13/21      PT LONG TERM GOAL #2   Title pt will return to hiking with good ankle stability and awareness of postures/control    Baseline weakness and instability at eval    Time 12    Period Weeks    Status New    Target Date 06/13/21      PT LONG TERM GOAL #3   Title pt will demo good control and posture while performing light plyometric activities and running    Baseline unable at eval    Time 12    Period Weeks    Status New    Target Date 06/13/21      PT LONG TERM GOAL #4   Title bil LE strength to gross 5/5 for proximal stability for distal support    Baseline NT at eval     Time 12    Period Weeks    Status New    Target Date 06/13/21                   Plan - 04/15/21 2025     Clinical Impression Statement Pt maintained ROM while on vacation and demo minimal swelling. Incr scar tissue build up noted, esp at medial aspect of incision. weakness in post tib and tendency to fall into eversion at heel strike notable. Will cont to challenge gross strength, ROM and stability to progress toward LTGs.    PT Treatment/Interventions ADLs/Self Care Home Management;Aquatic Therapy;Cryotherapy;Electrical Stimulation;Gait training;Moist Heat;Stair training;Functional mobility training;Therapeutic activities;Therapeutic exercise;Balance training;Neuromuscular re-education;Manual techniques;Patient/family education;Passive range of motion;Dry needling;Taping    PT Next Visit Plan prog single leg balance, eccentric heel raises    PT Home Exercise Plan [id number], walking backward, ice    Consulted and Agree with Plan of Care Patient             Patient will benefit from skilled therapeutic intervention in order to improve the following deficits and impairments:  Abnormal gait, Decreased range of motion, Difficulty walking, Increased muscle spasms, Decreased activity tolerance, Pain, Decreased balance, Hypomobility, Impaired flexibility, Improper body mechanics, Postural dysfunction, Increased edema, Decreased strength, Decreased mobility  Visit Diagnosis: Muscle weakness (generalized)  Stiffness of left ankle, not elsewhere classified  Pain in left ankle and joints of left foot  Localized edema  Difficulty in walking, not elsewhere classified     Problem List There are no problems to display for this patient.  Francely Craw C. Deirdra Heumann PT, DPT 04/15/21 8:28 PM   [facility] [place], [place], [address] Phone: [phone]   Fax:  [phone]  Name: ERNAN NAHAR MRN: [id number] Date  of Birth: 05-Mar-1971

## 2021-04-18 ENCOUNTER — Other Ambulatory Visit: Payer: Self-pay

## 2021-04-18 ENCOUNTER — Encounter (HOSPITAL_BASED_OUTPATIENT_CLINIC_OR_DEPARTMENT_OTHER): Payer: Self-pay | Admitting: Physical Therapy

## 2021-04-18 ENCOUNTER — Ambulatory Visit (HOSPITAL_BASED_OUTPATIENT_CLINIC_OR_DEPARTMENT_OTHER): Payer: 59 | Admitting: Physical Therapy

## 2021-04-18 DIAGNOSIS — M25672 Stiffness of left ankle, not elsewhere classified: Secondary | ICD-10-CM | POA: Diagnosis not present

## 2021-04-18 DIAGNOSIS — M6281 Muscle weakness (generalized): Secondary | ICD-10-CM | POA: Diagnosis not present

## 2021-04-18 DIAGNOSIS — M25572 Pain in left ankle and joints of left foot: Secondary | ICD-10-CM

## 2021-04-18 DIAGNOSIS — R600 Localized edema: Secondary | ICD-10-CM

## 2021-04-18 DIAGNOSIS — R262 Difficulty in walking, not elsewhere classified: Secondary | ICD-10-CM | POA: Diagnosis not present

## 2021-04-19 NOTE — Therapy (Signed)
[facility] [address] [place], [place], [address] Phone: [phone]   Fax:  [phone]  Physical Therapy Treatment  Patient Details  Name: Mitchell Melendez MRN: [id number] Date of Birth: August 03, 1971 Referring Provider (PT): Mechele Claude PA-C   Encounter Date: 04/18/2021   PT End of Session - 04/18/21 1604     Visit Number 8    Number of Visits 25    Date for PT Re-Evaluation 06/13/21    Authorization Type [facility]    PT Start Time 1603    PT Stop Time 1645    PT Time Calculation (min) 42 min    Activity Tolerance Patient tolerated treatment well    Behavior During Therapy [facility] for tasks assessed/performed             Past Medical History:  Diagnosis Date   Seasonal allergies     Past Surgical History:  Procedure Laterality Date   ACHILLES TENDON REPAIR Left 02/04/2021   DENTAL SURGERY     VASECTOMY      There were no vitals filed for this visit.   Subjective Assessment - 04/19/21 1643     Subjective It is pretty swollen and stiff. trying to move it throughout the day. no longer wearing compression stocking.    Patient Stated Goals golf, hike, run    Currently in Pain? No/denies                [facility] PT Assessment - 04/19/21 0001       Assessment   Medical Diagnosis s/p Lt achilles tendon repair    Referring Provider (PT) Mechele Claude PA-C      Sensation   Additional Comments [facility]      AROM   Left Ankle Dorsiflexion 0      PROM   Left Ankle Dorsiflexion 4      Strength   Left Hip Extension 4+/5    Left Hip ABduction 4/5    Left Ankle Plantar Flexion --   unable to perform SL heel raise   Left Ankle Inversion 4/5                           [facility] Adult PT Treatment/Exercise - 04/19/21 0001       Manual Therapy   Manual therapy comments ankle DF ROM    Edema Management Lt ankle in elevation    Joint Mobilization talocrural distraction    Soft tissue mobilization ant  tib      Ankle Exercises: Supine   T-Band inv, ever, DF red tband      Ankle Exercises: Machines for Strengthening   Cybex Leg Press shuttle: heel raises double & single- 1x25 decr to 12+6; single leg press 25*2+6; sidelying leg press 25*2      Ankle Exercises: Standing   Other Standing Ankle Exercises lateral step up on airex    Other Standing Ankle Exercises SLS on airex with added golfer hinge                      PT Short Term Goals - 04/15/21 2027       PT SHORT TERM GOAL #1   Title pt will be prepared with exercises, pain modulation techniques and supports for trip    Status Achieved      PT SHORT TERM GOAL #2   Title pt will demo heel toe gait with controlled knee extension  Status Achieved      PT SHORT TERM GOAL #3   Title pt will be independent in rice and mobility techniques throughout his days to control edema    Status Achieved               PT Long Term Goals - 03/18/21 0655       PT LONG TERM GOAL #1   Title pt will tolerate torque on ankle with good stability to return to golf    Baseline unable at eval    Time 12    Period Weeks    Status New    Target Date 06/13/21      PT LONG TERM GOAL #2   Title pt will return to hiking with good ankle stability and awareness of postures/control    Baseline weakness and instability at eval    Time 12    Period Weeks    Status New    Target Date 06/13/21      PT LONG TERM GOAL #3   Title pt will demo good control and posture while performing light plyometric activities and running    Baseline unable at eval    Time 12    Period Weeks    Status New    Target Date 06/13/21      PT LONG TERM GOAL #4   Title bil LE strength to gross 5/5 for proximal stability for distal support    Baseline NT at eval    Time 12    Period Weeks    Status New    Target Date 06/13/21                   Plan - 04/19/21 1644     Clinical Impression Statement Edema localized to proximal to  ankle, mostly lateral. still presents with good ROM and improved control of knee ext in stance phase. cont eversion in stance phase due to lack of strength. used shuttle to challenge proximal stability as well as begin gravity-reduced heel raises.    PT Treatment/Interventions ADLs/Self Care Home Management;Aquatic Therapy;Cryotherapy;Electrical Stimulation;Gait training;Moist Heat;Stair training;Functional mobility training;Therapeutic activities;Therapeutic exercise;Balance training;Neuromuscular re-education;Manual techniques;Patient/family education;Passive range of motion;Dry needling;Taping    PT Next Visit Plan cont shuttle strengthening, CKC ankle stability    PT Home Exercise Plan [id number], walking backward, ice    Consulted and Agree with Plan of Care Patient             Patient will benefit from skilled therapeutic intervention in order to improve the following deficits and impairments:  Abnormal gait, Decreased range of motion, Difficulty walking, Increased muscle spasms, Decreased activity tolerance, Pain, Decreased balance, Hypomobility, Impaired flexibility, Improper body mechanics, Postural dysfunction, Increased edema, Decreased strength, Decreased mobility  Visit Diagnosis: Muscle weakness (generalized)  Stiffness of left ankle, not elsewhere classified  Pain in left ankle and joints of left foot  Localized edema  Difficulty in walking, not elsewhere classified     Problem List There are no problems to display for this patient.  Tienna Bienkowski C. Kartel Wolbert PT, DPT 04/19/21 4:47 PM   [facility] [address] [place], [place], [address] Phone: [phone]   Fax:  [phone]  Name: Mitchell Melendez MRN: [id number] Date of Birth: 1971/01/12

## 2021-04-22 ENCOUNTER — Encounter (HOSPITAL_BASED_OUTPATIENT_CLINIC_OR_DEPARTMENT_OTHER): Payer: Self-pay | Admitting: Physical Therapy

## 2021-04-22 ENCOUNTER — Ambulatory Visit (HOSPITAL_BASED_OUTPATIENT_CLINIC_OR_DEPARTMENT_OTHER): Payer: 59 | Admitting: Physical Therapy

## 2021-04-22 ENCOUNTER — Other Ambulatory Visit: Payer: Self-pay

## 2021-04-22 DIAGNOSIS — M25672 Stiffness of left ankle, not elsewhere classified: Secondary | ICD-10-CM

## 2021-04-22 DIAGNOSIS — M6281 Muscle weakness (generalized): Secondary | ICD-10-CM | POA: Diagnosis not present

## 2021-04-22 DIAGNOSIS — R600 Localized edema: Secondary | ICD-10-CM | POA: Diagnosis not present

## 2021-04-22 DIAGNOSIS — R262 Difficulty in walking, not elsewhere classified: Secondary | ICD-10-CM | POA: Diagnosis not present

## 2021-04-22 DIAGNOSIS — M25572 Pain in left ankle and joints of left foot: Secondary | ICD-10-CM

## 2021-04-22 NOTE — Therapy (Signed)
[facility] [address] [place], [place], [address] Phone: [phone]   Fax:  [phone]  Physical Therapy Treatment  Patient Details  Name: Mitchell Melendez MRN: [id number] Date of Birth: [birth year] Referring Provider (PT): Mechele Claude PA-C   Encounter Date: 04/22/2021   PT End of Session - 04/22/21 1659     Visit Number 9    Number of Visits 25    Date for PT Re-Evaluation 06/13/21    Authorization Type [facility] U[facility]    PT Start Time 1601    PT Stop Time [id number]    PT Time Calculation (min) 40 min    Activity Tolerance Patient tolerated treatment well    Behavior During Therapy [facility] for tasks assessed/performed             Past Medical History:  Diagnosis Date   Seasonal allergies     Past Surgical History:  Procedure Laterality Date   ACHILLES TENDON REPAIR Left 02/04/2021   DENTAL SURGERY     VASECTOMY      There were no vitals filed for this visit.   Subjective Assessment - 04/22/21 1601     Subjective Was sore the next day after last appt but felt better the next day. Approaching normalcy in gait.    Patient Stated Goals golf, hike, run    Currently in Pain? No/denies                [facility] PT Assessment - 04/22/21 0001       AROM   Left Ankle Dorsiflexion 6      PROM   Left Ankle Dorsiflexion 10                           [facility] Adult PT Treatment/Exercise - 04/22/21 0001       Manual Therapy   Soft tissue mobilization IASTM distal achilles      Ankle Exercises: Sidelying   Ankle Eversion AROM;20 reps;Strengthening   PT guiding calcaneal motion     Ankle Exercises: Seated   Other Seated Ankle Exercises long arc quads 3lb      Ankle Exercises: Standing   SLS static prog to slow marching; tandem    Other Standing Ankle Exercises hamstring curl 3lb      Ankle Exercises: Machines for Strengthening   Cybex Leg Press shuttle: 1x25 double heel raisex20 single heel raise x20-  repeated with 20+6; marching single leg press 25+12 staying on toes-repeated with 1x25      Ankle Exercises: Stretches   Gastroc Stretch Limitations on shuttle- heels drop under plate                      PT Short Term Goals - 04/15/21 2027       PT SHORT TERM GOAL #1   Title pt will be prepared with exercises, pain modulation techniques and supports for trip    Status Achieved      PT SHORT TERM GOAL #2   Title pt will demo heel toe gait with controlled knee extension    Status Achieved      PT SHORT TERM GOAL #3   Title pt will be independent in rice and mobility techniques throughout his days to control edema    Status Achieved               PT Long Term Goals - 03/18/21 [id number]  PT LONG TERM GOAL #1   Title pt will tolerate torque on ankle with good stability to return to golf    Baseline unable at eval    Time 12    Period Weeks    Status New    Target Date 06/13/21      PT LONG TERM GOAL #2   Title pt will return to hiking with good ankle stability and awareness of postures/control    Baseline weakness and instability at eval    Time 12    Period Weeks    Status New    Target Date 06/13/21      PT LONG TERM GOAL #3   Title pt will demo good control and posture while performing light plyometric activities and running    Baseline unable at eval    Time 12    Period Weeks    Status New    Target Date 06/13/21      PT LONG TERM GOAL #4   Title bil LE strength to gross 5/5 for proximal stability for distal support    Baseline NT at eval    Time 12    Period Weeks    Status New    Target Date 06/13/21                   Plan - 04/22/21 1700     Clinical Impression Statement ROM improving significantly with decreased edema. Continued heel raise resistance on shuttle and asked him to begin double leg heel raises at home. Eversion to neutral avail in hindfoot contributing to lack of control in gait.    PT Treatment/Interventions  ADLs/Self Care Home Management;Aquatic Therapy;Cryotherapy;Electrical Stimulation;Gait training;Moist Heat;Stair training;Functional mobility training;Therapeutic activities;Therapeutic exercise;Balance training;Neuromuscular re-education;Manual techniques;Patient/family education;Passive range of motion;Dry needling;Taping    PT Next Visit Plan cont shuttle strengthening, CKC ankle stability    PT Home Exercise Plan [id number], walking backward, ice    Consulted and Agree with Plan of Care Patient             Patient will benefit from skilled therapeutic intervention in order to improve the following deficits and impairments:  Abnormal gait, Decreased range of motion, Difficulty walking, Increased muscle spasms, Decreased activity tolerance, Pain, Decreased balance, Hypomobility, Impaired flexibility, Improper body mechanics, Postural dysfunction, Increased edema, Decreased strength, Decreased mobility  Visit Diagnosis: Muscle weakness (generalized)  Stiffness of left ankle, not elsewhere classified  Pain in left ankle and joints of left foot  Localized edema  Difficulty in walking, not elsewhere classified     Problem List There are no problems to display for this patient.  Mitchell Melendez PT, DPT 04/22/21 5:02 PM   [facility] [address] [place], [place], [address] Phone: [phone]   Fax:  [phone]  Name: Mitchell Melendez MRN: [id number] Date of Birth: [birth year]

## 2021-04-25 ENCOUNTER — Ambulatory Visit (HOSPITAL_BASED_OUTPATIENT_CLINIC_OR_DEPARTMENT_OTHER): Payer: 59 | Admitting: Physical Therapy

## 2021-04-29 ENCOUNTER — Ambulatory Visit (HOSPITAL_BASED_OUTPATIENT_CLINIC_OR_DEPARTMENT_OTHER): Payer: 59 | Admitting: Physical Therapy

## 2021-04-29 ENCOUNTER — Encounter (HOSPITAL_BASED_OUTPATIENT_CLINIC_OR_DEPARTMENT_OTHER): Payer: Self-pay | Admitting: Physical Therapy

## 2021-04-29 ENCOUNTER — Other Ambulatory Visit: Payer: Self-pay

## 2021-04-29 DIAGNOSIS — R262 Difficulty in walking, not elsewhere classified: Secondary | ICD-10-CM

## 2021-04-29 DIAGNOSIS — M25572 Pain in left ankle and joints of left foot: Secondary | ICD-10-CM | POA: Diagnosis not present

## 2021-04-29 DIAGNOSIS — R600 Localized edema: Secondary | ICD-10-CM | POA: Diagnosis not present

## 2021-04-29 DIAGNOSIS — M6281 Muscle weakness (generalized): Secondary | ICD-10-CM

## 2021-04-29 DIAGNOSIS — M25672 Stiffness of left ankle, not elsewhere classified: Secondary | ICD-10-CM

## 2021-04-29 NOTE — Therapy (Signed)
[place], [place], [address] Phone: [phone]   Fax:  [phone]  Physical Therapy Treatment  Patient Details  Name: Mitchell Melendez MRN: [id number] Date of Birth: 26-Aug-1971 Referring Provider (PT): Mechele Claude PA-C   Encounter Date: 04/29/2021   PT End of Session - 04/29/21 1603     Visit Number 10    Number of Visits 25    Date for PT Re-Evaluation 06/13/21    Authorization Type [facility]    PT Start Time 1601    PT Stop Time [id number]    PT Time Calculation (min) 40 min    Activity Tolerance Patient tolerated treatment well    Behavior During Therapy [facility] for tasks assessed/performed             Past Medical History:  Diagnosis Date   Seasonal allergies     Past Surgical History:  Procedure Laterality Date   ACHILLES TENDON REPAIR Left 02/04/2021   DENTAL SURGERY     VASECTOMY      There were no vitals filed for this visit.   Subjective Assessment - 04/29/21 1603     Subjective A little swollen and tender after being up a lot yesterday.    Patient Stated Goals golf, hike, run    Currently in Pain? Yes    Pain Score 2     Pain Location Ankle    Pain Descriptors / Indicators Aching                               [facility] Adult PT Treatment/Exercise - 04/29/21 0001       Manual Therapy   Manual therapy comments ankle DF ROM- with controlled eversion    Edema Management Lt ankle in elevation      Ankle Exercises: Standing   SLS Lt SLS with Rt foot 5 point reach, y reach; UE ABCs with ball    Other Standing Ankle Exercises bil heel raise, ball bw ankle; double raise with single lower- low range    Other Standing Ankle Exercises lateral stpe down 4" step      Ankle Exercises: Machines for Strengthening   Cybex Leg Press shuttle- 25+6 double heel raise with single lower & double leg heel raises; 25x3 leg press neutral & turnout                       PT Short Term Goals - 04/15/21 2027       PT SHORT TERM GOAL #1   Title pt will be prepared with exercises, pain modulation techniques and supports for trip    Status Achieved      PT SHORT TERM GOAL #2   Title pt will demo heel toe gait with controlled knee extension    Status Achieved      PT SHORT TERM GOAL #3   Title pt will be independent in rice and mobility techniques throughout his days to control edema    Status Achieved               PT Long Term Goals - 03/18/21 0655       PT LONG TERM GOAL #1   Title pt will tolerate torque on ankle with good stability to return to golf    Baseline unable at eval    Time 12    Period Weeks    Status New  Target Date 06/13/21      PT LONG TERM GOAL #2   Title pt will return to hiking with good ankle stability and awareness of postures/control    Baseline weakness and instability at eval    Time 12    Period Weeks    Status New    Target Date 06/13/21      PT LONG TERM GOAL #3   Title pt will demo good control and posture while performing light plyometric activities and running    Baseline unable at eval    Time 12    Period Weeks    Status New    Target Date 06/13/21      PT LONG TERM GOAL #4   Title bil LE strength to gross 5/5 for proximal stability for distal support    Baseline NT at eval    Time 12    Period Weeks    Status New    Target Date 06/13/21                   Plan - 04/29/21 1645     Clinical Impression Statement Discomfort with unilateral heel lowering in standing so moved to shuttle. Added SLS coordinated control in UE ABCs. Cont edema with incr gait demands as expected. lump at medal aspect, superior portion of achilles that was tender and will monitor.    PT Treatment/Interventions ADLs/Self Care Home Management;Aquatic Therapy;Cryotherapy;Electrical Stimulation;Gait training;Moist Heat;Stair training;Functional mobility training;Therapeutic activities;Therapeutic exercise;Balance  training;Neuromuscular re-education;Manual techniques;Patient/family education;Passive range of motion;Dry needling;Taping    PT Next Visit Plan assess outcome of incr challenges    PT Home Exercise Plan [id number], walking backward, ice    Consulted and Agree with Plan of Care Patient             Patient will benefit from skilled therapeutic intervention in order to improve the following deficits and impairments:  Abnormal gait, Decreased range of motion, Difficulty walking, Increased muscle spasms, Decreased activity tolerance, Pain, Decreased balance, Hypomobility, Impaired flexibility, Improper body mechanics, Postural dysfunction, Increased edema, Decreased strength, Decreased mobility  Visit Diagnosis: Muscle weakness (generalized)  Stiffness of left ankle, not elsewhere classified  Pain in left ankle and joints of left foot  Localized edema  Difficulty in walking, not elsewhere classified     Problem List There are no problems to display for this patient.  Jermeka Schlotterbeck C. Telisha Zawadzki PT, DPT 04/29/21 4:48 PM   [facility] [address] [place], [place], [address] Phone: [phone]   Fax:  [phone]  Name: Mitchell Melendez MRN: [id number] Date of Birth: Nov 26, 1970

## 2021-05-02 ENCOUNTER — Ambulatory Visit (HOSPITAL_BASED_OUTPATIENT_CLINIC_OR_DEPARTMENT_OTHER): Payer: 59 | Admitting: Physical Therapy

## 2021-05-02 ENCOUNTER — Other Ambulatory Visit: Payer: Self-pay

## 2021-05-02 ENCOUNTER — Encounter (HOSPITAL_BASED_OUTPATIENT_CLINIC_OR_DEPARTMENT_OTHER): Payer: Self-pay | Admitting: Physical Therapy

## 2021-05-02 DIAGNOSIS — R262 Difficulty in walking, not elsewhere classified: Secondary | ICD-10-CM

## 2021-05-02 DIAGNOSIS — M25572 Pain in left ankle and joints of left foot: Secondary | ICD-10-CM | POA: Diagnosis not present

## 2021-05-02 DIAGNOSIS — R600 Localized edema: Secondary | ICD-10-CM | POA: Diagnosis not present

## 2021-05-02 DIAGNOSIS — M25672 Stiffness of left ankle, not elsewhere classified: Secondary | ICD-10-CM | POA: Diagnosis not present

## 2021-05-02 DIAGNOSIS — M6281 Muscle weakness (generalized): Secondary | ICD-10-CM | POA: Diagnosis not present

## 2021-05-02 NOTE — Therapy (Signed)
[place] [address] [place], [place], [address] Phone: [phone]   Fax:  [phone]  Physical Therapy Treatment  Patient Details  Name: Mitchell Melendez MRN: [id number] Date of Birth: [birth year] Referring Provider (PT): Mechele Claude PA-C   Encounter Date: 05/02/2021   PT End of Session - 05/02/21 1610     Visit Number 11    Number of Visits 25    Date for PT Re-Evaluation 06/13/21    Authorization Type [facility]    PT Start Time 1608    PT Stop Time 1644    PT Time Calculation (min) 36 min    Activity Tolerance Patient tolerated treatment well    Behavior During Therapy [facility] for tasks assessed/performed             Past Medical History:  Diagnosis Date   Seasonal allergies     Past Surgical History:  Procedure Laterality Date   ACHILLES TENDON REPAIR Left 02/04/2021   DENTAL SURGERY     VASECTOMY      There were no vitals filed for this visit.   Subjective Assessment - 05/02/21 1609     Subjective I was sore on Wed after last appointment. Yesterday went through all of exercises but no pain, just mild swelling.    Patient Stated Goals golf, hike, run    Currently in Pain? No/denies                [facility] PT Assessment - 05/02/21 0001       Assessment   Medical Diagnosis s/p Lt achilles tendon repair    Referring Provider (PT) Mechele Claude PA-C      High Level Balance   High Level Balance Comments able to hold single leg balance with incr motion in ankle strategy.                           [facility] Adult PT Treatment/Exercise - 05/02/21 0001       Manual Therapy   Edema Management Lt ankle in elevation    Soft tissue mobilization medial achilles scar tissue mobs      Ankle Exercises: Machines for Strengthening   Cybex Leg Press shuttle: 2x25 upright single leg press- added heel raise, single leg heel raise, double leg heel raise; 25x2+12 supine double leg press, single leg  press, double leg heel raises; 25+12 supine single leg heel raise; 25+12 supine mini hops; 25+12 Rt SL leg press, 25+6 leg press on toes      Ankle Exercises: Standing   SLS golfer hinge                      PT Short Term Goals - 04/15/21 2027       PT SHORT TERM GOAL #1   Title pt will be prepared with exercises, pain modulation techniques and supports for trip    Status Achieved      PT SHORT TERM GOAL #2   Title pt will demo heel toe gait with controlled knee extension    Status Achieved      PT SHORT TERM GOAL #3   Title pt will be independent in rice and mobility techniques throughout his days to control edema    Status Achieved               PT Long Term Goals - 03/18/21 0655       PT LONG TERM  GOAL #1   Title pt will tolerate torque on ankle with good stability to return to golf    Baseline unable at eval    Time 12    Period Weeks    Status New    Target Date 06/13/21      PT LONG TERM GOAL #2   Title pt will return to hiking with good ankle stability and awareness of postures/control    Baseline weakness and instability at eval    Time 12    Period Weeks    Status New    Target Date 06/13/21      PT LONG TERM GOAL #3   Title pt will demo good control and posture while performing light plyometric activities and running    Baseline unable at eval    Time 12    Period Weeks    Status New    Target Date 06/13/21      PT LONG TERM GOAL #4   Title bil LE strength to gross 5/5 for proximal stability for distal support    Baseline NT at eval    Time 12    Period Weeks    Status New    Target Date 06/13/21                   Plan - 05/02/21 1949     Clinical Impression Statement progressed exercises on shuttle to utilize LE biomechanical chain. Good activation along gastroc. Able to demo static SLS control with large amplitude ankle strategy motion, added dynamic motion in SLS requiring further control from ankle strategy.    PT  Treatment/Interventions ADLs/Self Care Home Management;Aquatic Therapy;Cryotherapy;Electrical Stimulation;Gait training;Moist Heat;Stair training;Functional mobility training;Therapeutic activities;Therapeutic exercise;Balance training;Neuromuscular re-education;Manual techniques;Patient/family education;Passive range of motion;Dry needling;Taping    PT Next Visit Plan cont to progress challenges to gastroc strength    PT Home Exercise Plan [id number], walking backward, ice    Consulted and Agree with Plan of Care Patient             Patient will benefit from skilled therapeutic intervention in order to improve the following deficits and impairments:  Abnormal gait, Decreased range of motion, Difficulty walking, Increased muscle spasms, Decreased activity tolerance, Pain, Decreased balance, Hypomobility, Impaired flexibility, Improper body mechanics, Postural dysfunction, Increased edema, Decreased strength, Decreased mobility  Visit Diagnosis: Muscle weakness (generalized)  Stiffness of left ankle, not elsewhere classified  Pain in left ankle and joints of left foot  Localized edema  Difficulty in walking, not elsewhere classified     Problem List There are no problems to display for this patient.  Johnattan Strassman C. Grier Vu PT, DPT 05/02/21 7:57 PM   [facility] [address] [place], [place], [address] Phone: [phone]   Fax:  [phone]  Name: TAIVON TOOMES MRN: [id number] Date of Birth: 15-Dec-1970

## 2021-05-06 ENCOUNTER — Ambulatory Visit (HOSPITAL_BASED_OUTPATIENT_CLINIC_OR_DEPARTMENT_OTHER): Payer: 59 | Admitting: Physical Therapy

## 2021-05-06 ENCOUNTER — Encounter (HOSPITAL_BASED_OUTPATIENT_CLINIC_OR_DEPARTMENT_OTHER): Payer: Self-pay | Admitting: Physical Therapy

## 2021-05-06 ENCOUNTER — Other Ambulatory Visit: Payer: Self-pay

## 2021-05-06 DIAGNOSIS — M25672 Stiffness of left ankle, not elsewhere classified: Secondary | ICD-10-CM

## 2021-05-06 DIAGNOSIS — Z1211 Encounter for screening for malignant neoplasm of colon: Secondary | ICD-10-CM | POA: Diagnosis not present

## 2021-05-06 DIAGNOSIS — R600 Localized edema: Secondary | ICD-10-CM

## 2021-05-06 DIAGNOSIS — M25572 Pain in left ankle and joints of left foot: Secondary | ICD-10-CM | POA: Diagnosis not present

## 2021-05-06 DIAGNOSIS — R262 Difficulty in walking, not elsewhere classified: Secondary | ICD-10-CM | POA: Diagnosis not present

## 2021-05-06 DIAGNOSIS — M545 Low back pain, unspecified: Secondary | ICD-10-CM | POA: Diagnosis not present

## 2021-05-06 DIAGNOSIS — M6281 Muscle weakness (generalized): Secondary | ICD-10-CM | POA: Diagnosis not present

## 2021-05-06 DIAGNOSIS — Z0000 Encounter for general adult medical examination without abnormal findings: Secondary | ICD-10-CM | POA: Diagnosis not present

## 2021-05-06 DIAGNOSIS — R7303 Prediabetes: Secondary | ICD-10-CM | POA: Diagnosis not present

## 2021-05-06 DIAGNOSIS — Z125 Encounter for screening for malignant neoplasm of prostate: Secondary | ICD-10-CM | POA: Diagnosis not present

## 2021-05-06 DIAGNOSIS — J309 Allergic rhinitis, unspecified: Secondary | ICD-10-CM | POA: Diagnosis not present

## 2021-05-06 DIAGNOSIS — Z85828 Personal history of other malignant neoplasm of skin: Secondary | ICD-10-CM | POA: Diagnosis not present

## 2021-05-06 DIAGNOSIS — E7800 Pure hypercholesterolemia, unspecified: Secondary | ICD-10-CM | POA: Diagnosis not present

## 2021-05-06 NOTE — Therapy (Signed)
[place], [place], [address] Phone: [phone]   Fax:  [phone]  Physical Therapy Treatment  Patient Details  Name: Mitchell Melendez MRN: [id number] Date of Birth: 03/21/1971 Referring Provider (PT): Mechele Claude PA-C   Encounter Date: 05/06/2021   PT End of Session - 05/06/21 0840     Visit Number 12    Number of Visits 25    Date for PT Re-Evaluation 06/13/21    Authorization Type [facility]    PT Start Time 0802    PT Stop Time 0840    PT Time Calculation (min) 38 min    Activity Tolerance Patient tolerated treatment well    Behavior During Therapy [facility] for tasks assessed/performed             Past Medical History:  Diagnosis Date   Seasonal allergies     Past Surgical History:  Procedure Laterality Date   ACHILLES TENDON REPAIR Left 02/04/2021   DENTAL SURGERY     VASECTOMY      There were no vitals filed for this visit.   Subjective Assessment - 05/06/21 0802     Subjective Stiff and puffy but not hurting.    Currently in Pain? No/denies                               [facility] Adult PT Treatment/Exercise - 05/06/21 0001       Manual Therapy   Soft tissue mobilization achilles mob at mid tendon- scar tissue mobs      Ankle Exercises: Standing   SLS with head turns on airex, with cone taps    Toe Walk (Round Trip) 30 foot segments with rest in between    Other Standing Ankle Exercises chops    Other Standing Ankle Exercises single leg diver      Ankle Exercises: Stretches   Other Stretch supine HS stretch with strap; supine hip flexor/quad stretch    Other Stretch figure 4 stretch                      PT Short Term Goals - 04/15/21 2027       PT SHORT TERM GOAL #1   Title pt will be prepared with exercises, pain modulation techniques and supports for trip    Status Achieved      PT SHORT TERM GOAL #2   Title pt will demo heel toe gait  with controlled knee extension    Status Achieved      PT SHORT TERM GOAL #3   Title pt will be independent in rice and mobility techniques throughout his days to control edema    Status Achieved               PT Long Term Goals - 03/18/21 0655       PT LONG TERM GOAL #1   Title pt will tolerate torque on ankle with good stability to return to golf    Baseline unable at eval    Time 12    Period Weeks    Status New    Target Date 06/13/21      PT LONG TERM GOAL #2   Title pt will return to hiking with good ankle stability and awareness of postures/control    Baseline weakness and instability at eval    Time 12    Period Weeks  Status New    Target Date 06/13/21      PT LONG TERM GOAL #3   Title pt will demo good control and posture while performing light plyometric activities and running    Baseline unable at eval    Time 12    Period Weeks    Status New    Target Date 06/13/21      PT LONG TERM GOAL #4   Title bil LE strength to gross 5/5 for proximal stability for distal support    Baseline NT at eval    Time 12    Period Weeks    Status New    Target Date 06/13/21                   Plan - 05/06/21 1258     Clinical Impression Statement Scar tissue build up is mobile and will continue to encourage mobility. Still had some pinching with increased resistance on shuttle so resistance was brought back down. Added toe walking to HEP to continue with gastroc strength and endurance. began with unstable surface in CKC but reported incr discomfort in long arch of foot so pad was removed at that point.    PT Treatment/Interventions ADLs/Self Care Home Management;Aquatic Therapy;Cryotherapy;Electrical Stimulation;Gait training;Moist Heat;Stair training;Functional mobility training;Therapeutic activities;Therapeutic exercise;Balance training;Neuromuscular re-education;Manual techniques;Patient/family education;Passive range of motion;Dry needling;Taping    PT  Next Visit Plan review toe walking, cont to progress unstable surface tolerance    PT Home Exercise Plan [id number], walking backward, ice    Consulted and Agree with Plan of Care Patient             Patient will benefit from skilled therapeutic intervention in order to improve the following deficits and impairments:  Abnormal gait, Decreased range of motion, Difficulty walking, Increased muscle spasms, Decreased activity tolerance, Pain, Decreased balance, Hypomobility, Impaired flexibility, Improper body mechanics, Postural dysfunction, Increased edema, Decreased strength, Decreased mobility  Visit Diagnosis: Muscle weakness (generalized)  Stiffness of left ankle, not elsewhere classified  Pain in left ankle and joints of left foot  Localized edema  Difficulty in walking, not elsewhere classified     Problem List There are no problems to display for this patient.  Raniyah Curenton C. Ashli Selders PT, DPT 05/06/21 1:01 PM   [facility] [address] [place], [place], [address] Phone: [phone]   Fax:  [phone]  Name: Mitchell Melendez MRN: [id number] Date of Birth: 1970/12/25

## 2021-05-09 ENCOUNTER — Encounter (HOSPITAL_BASED_OUTPATIENT_CLINIC_OR_DEPARTMENT_OTHER): Payer: Self-pay | Admitting: Physical Therapy

## 2021-05-09 ENCOUNTER — Ambulatory Visit (HOSPITAL_BASED_OUTPATIENT_CLINIC_OR_DEPARTMENT_OTHER): Payer: 59 | Attending: Student | Admitting: Physical Therapy

## 2021-05-09 ENCOUNTER — Other Ambulatory Visit: Payer: Self-pay

## 2021-05-09 DIAGNOSIS — M25672 Stiffness of left ankle, not elsewhere classified: Secondary | ICD-10-CM | POA: Diagnosis not present

## 2021-05-09 DIAGNOSIS — H5213 Myopia, bilateral: Secondary | ICD-10-CM | POA: Diagnosis not present

## 2021-05-09 DIAGNOSIS — M25572 Pain in left ankle and joints of left foot: Secondary | ICD-10-CM | POA: Diagnosis not present

## 2021-05-09 DIAGNOSIS — R262 Difficulty in walking, not elsewhere classified: Secondary | ICD-10-CM | POA: Diagnosis not present

## 2021-05-09 DIAGNOSIS — M6281 Muscle weakness (generalized): Secondary | ICD-10-CM | POA: Insufficient documentation

## 2021-05-09 DIAGNOSIS — R600 Localized edema: Secondary | ICD-10-CM | POA: Diagnosis not present

## 2021-05-09 NOTE — Therapy (Signed)
[place], [place], [address] Phone: [phone]   Fax:  [phone]  Physical Therapy Treatment  Patient Details  Name: Mitchell Melendez MRN: [id number] Date of Birth: September 21, 1970 Referring Provider (PT): Mechele Claude PA-C   Encounter Date: 05/09/2021   PT End of Session - 05/09/21 1435     Visit Number 13    Number of Visits 25    Date for PT Re-Evaluation 06/13/21    Authorization Type [facility]    PT Start Time 1435    PT Stop Time 1513    PT Time Calculation (min) 38 min    Activity Tolerance Patient tolerated treatment well    Behavior During Therapy [facility] for tasks assessed/performed             Past Medical History:  Diagnosis Date   Seasonal allergies     Past Surgical History:  Procedure Laterality Date   ACHILLES TENDON REPAIR Left 02/04/2021   DENTAL SURGERY     VASECTOMY      There were no vitals filed for this visit.   Subjective Assessment - 05/09/21 1436     Subjective toe walking really wore out my calf.    Patient Stated Goals golf, hike, run    Currently in Pain? No/denies                               [facility] Adult PT Treatment/Exercise - 05/09/21 0001       Manual Therapy   Soft tissue mobilization IASTM gastroc; scar tissue mobilization      Ankle Exercises: Standing   Other Standing Ankle Exercises ladder drills on toes- without plyometrics    Other Standing Ankle Exercises airex beam- stepping over hurdles, lateral stepping with heels off of beam      Ankle Exercises: Stretches   Other Stretch slant board gastroc & soleus      Ankle Exercises: Machines for Strengthening   Cybex Leg Press shuttle 3*25 heel raises, leg press; 2/25 SL single leg press- flat foot & on toes                      PT Short Term Goals - 04/15/21 2027       PT SHORT TERM GOAL #1   Title pt will be prepared with exercises, pain modulation techniques  and supports for trip    Status Achieved      PT SHORT TERM GOAL #2   Title pt will demo heel toe gait with controlled knee extension    Status Achieved      PT SHORT TERM GOAL #3   Title pt will be independent in rice and mobility techniques throughout his days to control edema    Status Achieved               PT Long Term Goals - 03/18/21 [id number]       PT LONG TERM GOAL #1   Title pt will tolerate torque on ankle with good stability to return to golf    Baseline unable at eval    Time 12    Period Weeks    Status New    Target Date 06/13/21      PT LONG TERM GOAL #2   Title pt will return to hiking with good ankle stability and awareness of postures/control    Baseline weakness and instability at  eval    Time 12    Period Weeks    Status New    Target Date 06/13/21      PT LONG TERM GOAL #3   Title pt will demo good control and posture while performing light plyometric activities and running    Baseline unable at eval    Time 12    Period Weeks    Status New    Target Date 06/13/21      PT LONG TERM GOAL #4   Title bil LE strength to gross 5/5 for proximal stability for distal support    Baseline NT at eval    Time 12    Period Weeks    Status New    Target Date 06/13/21                   Plan - 05/09/21 1523     Clinical Impression Statement improving stability in ankle notable in balance challenges. Fatigues in a posture with heels off of the ground and we discussed taking breaks to avoid incr in pain. Will cont to work on strength in single leg activation as double leg continues to get easier.    PT Treatment/Interventions ADLs/Self Care Home Management;Aquatic Therapy;Cryotherapy;Electrical Stimulation;Gait training;Moist Heat;Stair training;Functional mobility training;Therapeutic activities;Therapeutic exercise;Balance training;Neuromuscular re-education;Manual techniques;Patient/family education;Passive range of motion;Dry needling;Taping     PT Next Visit Plan try gastroc press machine, rotation in CKC    PT Home Exercise Plan [id number], walking backward, ice    Consulted and Agree with Plan of Care Patient             Patient will benefit from skilled therapeutic intervention in order to improve the following deficits and impairments:  Abnormal gait, Decreased range of motion, Difficulty walking, Increased muscle spasms, Decreased activity tolerance, Pain, Decreased balance, Hypomobility, Impaired flexibility, Improper body mechanics, Postural dysfunction, Increased edema, Decreased strength, Decreased mobility  Visit Diagnosis: Muscle weakness (generalized)  Stiffness of left ankle, not elsewhere classified  Pain in left ankle and joints of left foot     Problem List There are no problems to display for this patient.  Marquerite Forsman C. Vontrell Pullman PT, DPT 05/09/21 3:29 PM   [facility] [address] [place], [place], [address] Phone: [phone]   Fax:  [phone]  Name: Mitchell Melendez MRN: [id number] Date of Birth: [birth year]

## 2021-05-13 ENCOUNTER — Other Ambulatory Visit: Payer: Self-pay

## 2021-05-13 ENCOUNTER — Ambulatory Visit (HOSPITAL_BASED_OUTPATIENT_CLINIC_OR_DEPARTMENT_OTHER): Payer: 59 | Admitting: Physical Therapy

## 2021-05-13 DIAGNOSIS — M25572 Pain in left ankle and joints of left foot: Secondary | ICD-10-CM

## 2021-05-13 DIAGNOSIS — M25672 Stiffness of left ankle, not elsewhere classified: Secondary | ICD-10-CM | POA: Diagnosis not present

## 2021-05-13 DIAGNOSIS — R600 Localized edema: Secondary | ICD-10-CM

## 2021-05-13 DIAGNOSIS — M6281 Muscle weakness (generalized): Secondary | ICD-10-CM

## 2021-05-13 DIAGNOSIS — R262 Difficulty in walking, not elsewhere classified: Secondary | ICD-10-CM | POA: Diagnosis not present

## 2021-05-13 NOTE — Therapy (Signed)
[place], [place], [address] Phone: [phone]   Fax:  [phone]  Physical Therapy Treatment  Patient Details  Name: Mitchell Melendez MRN: [id number] Date of Birth: 09/09/1970 Referring Provider (PT): Mechele Claude PA-C   Encounter Date: 05/13/2021   PT End of Session - 05/13/21 1645     Visit Number 14    Number of Visits 25    Date for PT Re-Evaluation 06/13/21    Authorization Type [facility]    PT Start Time [id number]    PT Stop Time [id number]    PT Time Calculation (min) 38 min    Activity Tolerance Patient tolerated treatment well    Behavior During Therapy [facility] for tasks assessed/performed             Past Medical History:  Diagnosis Date   Seasonal allergies     Past Surgical History:  Procedure Laterality Date   ACHILLES TENDON REPAIR Left 02/04/2021   DENTAL SURGERY     VASECTOMY      There were no vitals filed for this visit.                      [facility] Adult PT Treatment/Exercise - 05/13/21 0001       Manual Therapy   Edema Management mobilization to edema just proximal to ankle    Soft tissue mobilization scar tissue mobs in gastroc      Ankle Exercises: Machines for Strengthening   Cybex Leg Press shuttle 3*25 heel raises; 2*25+6 eccentric lower single leg      Ankle Exercises: Standing   SLS on airex with head turns, off airex EC    Other Standing Ankle Exercises squat to stand+heel raise    Other Standing Ankle Exercises tandem with head turns                      PT Short Term Goals - 04/15/21 2027       PT SHORT TERM GOAL #1   Title pt will be prepared with exercises, pain modulation techniques and supports for trip    Status Achieved      PT SHORT TERM GOAL #2   Title pt will demo heel toe gait with controlled knee extension    Status Achieved      PT SHORT TERM GOAL #3   Title pt will be independent in rice and mobility techniques  throughout his days to control edema    Status Achieved               PT Long Term Goals - 03/18/21 0655       PT LONG TERM GOAL #1   Title pt will tolerate torque on ankle with good stability to return to golf    Baseline unable at eval    Time 12    Period Weeks    Status New    Target Date 06/13/21      PT LONG TERM GOAL #2   Title pt will return to hiking with good ankle stability and awareness of postures/control    Baseline weakness and instability at eval    Time 12    Period Weeks    Status New    Target Date 06/13/21      PT LONG TERM GOAL #3   Title pt will demo good control and posture while performing light plyometric activities and running    Baseline unable  at eval    Time 12    Period Weeks    Status New    Target Date 06/13/21      PT LONG TERM GOAL #4   Title bil LE strength to gross 5/5 for proximal stability for distal support    Baseline NT at eval    Time 12    Period Weeks    Status New    Target Date 06/13/21                   Plan - 05/13/21 1653     Clinical Impression Statement began session with strength to fatigue slightly before challenging balance. Good control with EC but significant motion in head turns. squat to heel raise for training for pre-plyometric. will cont to watch alignment in squat to avoid favoring Rt side.    PT Treatment/Interventions ADLs/Self Care Home Management;Aquatic Therapy;Cryotherapy;Electrical Stimulation;Gait training;Moist Heat;Stair training;Functional mobility training;Therapeutic activities;Therapeutic exercise;Balance training;Neuromuscular re-education;Manual techniques;Patient/family education;Passive range of motion;Dry needling;Taping    PT Next Visit Plan CKC rotation, deeper squats    PT Home Exercise Plan [id number], walking backward, ice    Consulted and Agree with Plan of Care Patient             Patient will benefit from skilled therapeutic intervention in order to improve the  following deficits and impairments:  Abnormal gait, Decreased range of motion, Difficulty walking, Increased muscle spasms, Decreased activity tolerance, Pain, Decreased balance, Hypomobility, Impaired flexibility, Improper body mechanics, Postural dysfunction, Increased edema, Decreased strength, Decreased mobility  Visit Diagnosis: Muscle weakness (generalized)  Stiffness of left ankle, not elsewhere classified  Pain in left ankle and joints of left foot  Localized edema  Difficulty in walking, not elsewhere classified     Problem List There are no problems to display for this patient. Tashayla Therien C. Teofilo Lupinacci PT, DPT 05/13/21 4:56 PM   [facility] [address] [place], [place], [address] Phone: [phone]   Fax:  [phone]  Name: Mitchell Melendez MRN: [id number] Date of Birth: 18-Sep-1970

## 2021-05-16 ENCOUNTER — Ambulatory Visit (HOSPITAL_BASED_OUTPATIENT_CLINIC_OR_DEPARTMENT_OTHER): Payer: 59 | Admitting: Physical Therapy

## 2021-05-16 ENCOUNTER — Other Ambulatory Visit: Payer: Self-pay

## 2021-05-16 ENCOUNTER — Encounter (HOSPITAL_BASED_OUTPATIENT_CLINIC_OR_DEPARTMENT_OTHER): Payer: Self-pay | Admitting: Physical Therapy

## 2021-05-16 DIAGNOSIS — R262 Difficulty in walking, not elsewhere classified: Secondary | ICD-10-CM | POA: Diagnosis not present

## 2021-05-16 DIAGNOSIS — R600 Localized edema: Secondary | ICD-10-CM | POA: Diagnosis not present

## 2021-05-16 DIAGNOSIS — M25672 Stiffness of left ankle, not elsewhere classified: Secondary | ICD-10-CM | POA: Diagnosis not present

## 2021-05-16 DIAGNOSIS — M25572 Pain in left ankle and joints of left foot: Secondary | ICD-10-CM | POA: Diagnosis not present

## 2021-05-16 DIAGNOSIS — M6281 Muscle weakness (generalized): Secondary | ICD-10-CM | POA: Diagnosis not present

## 2021-05-16 NOTE — Therapy (Signed)
[place], [place], [address] Phone: [phone]   Fax:  [phone]  Physical Therapy Treatment  Patient Details  Name: Mitchell Melendez MRN: [id number] Date of Birth: 1971/07/06 Referring Provider (PT): Mechele Claude PA-C   Encounter Date: 05/16/2021   PT End of Session - 05/16/21 0849     Visit Number 15    Number of Visits 25    Date for PT Re-Evaluation 06/13/21    Authorization Type [facility]    PT Start Time 0848    PT Stop Time 0926    PT Time Calculation (min) 38 min    Activity Tolerance Patient tolerated treatment well    Behavior During Therapy [facility] for tasks assessed/performed             Past Medical History:  Diagnosis Date   Seasonal allergies     Past Surgical History:  Procedure Laterality Date   ACHILLES TENDON REPAIR Left 02/04/2021   DENTAL SURGERY     VASECTOMY      There were no vitals filed for this visit.   Subjective Assessment - 05/16/21 0849     Subjective A little sore after last visit but ok today    Currently in Pain? No/denies                               [facility] Adult PT Treatment/Exercise - 05/16/21 0001       Manual Therapy   Edema Management proximal Lt ankle    Soft tissue mobilization scar mobilization & tissue mobs at achilles; roller Rt gastroc end of session      Ankle Exercises: Machines for Strengthening   Cybex Leg Press shuttle 3*25 double leg press in PF, also singl leg press- mirror for VC to keep heel lifted; 6+12 double leg hopping      Ankle Exercises: Standing   SLS anchored trunk rotation red tband, antirotation punches red tband    Other Standing Ankle Exercises intro to jogging    Other Standing Ankle Exercises deepsquatting, 10lb kettle bell      Ankle Exercises: Stretches   Other Stretch slant board gastroc stretch 2x30s                       PT Short Term Goals - 04/15/21 2027       PT  SHORT TERM GOAL #1   Title pt will be prepared with exercises, pain modulation techniques and supports for trip    Status Achieved      PT SHORT TERM GOAL #2   Title pt will demo heel toe gait with controlled knee extension    Status Achieved      PT SHORT TERM GOAL #3   Title pt will be independent in rice and mobility techniques throughout his days to control edema    Status Achieved               PT Long Term Goals - 03/18/21 0655       PT LONG TERM GOAL #1   Title pt will tolerate torque on ankle with good stability to return to golf    Baseline unable at eval    Time 12    Period Weeks    Status New    Target Date 06/13/21      PT LONG TERM GOAL #2   Title pt will return  to hiking with good ankle stability and awareness of postures/control    Baseline weakness and instability at eval    Time 12    Period Weeks    Status New    Target Date 06/13/21      PT LONG TERM GOAL #3   Title pt will demo good control and posture while performing light plyometric activities and running    Baseline unable at eval    Time 12    Period Weeks    Status New    Target Date 06/13/21      PT LONG TERM GOAL #4   Title bil LE strength to gross 5/5 for proximal stability for distal support    Baseline NT at eval    Time 12    Period Weeks    Status New    Target Date 06/13/21                   Plan - 05/16/21 [id number]     Clinical Impression Statement Added small flight phase in gentle jogging on even surface and straight line. Good control of ankle in flight phase wiht decreased push off as expected from Lt foot. replaced chops in HEP for anchored resisted trunk rotation in SLS.    PT Treatment/Interventions ADLs/Self Care Home Management;Aquatic Therapy;Cryotherapy;Electrical Stimulation;Gait training;Moist Heat;Stair training;Functional mobility training;Therapeutic activities;Therapeutic exercise;Balance training;Neuromuscular re-education;Manual  techniques;Patient/family education;Passive range of motion;Dry needling;Taping    PT Next Visit Plan cont jogging challenge as appropriate    PT Home Exercise Plan [id number], walking backward, ice    Consulted and Agree with Plan of Care Patient             Patient will benefit from skilled therapeutic intervention in order to improve the following deficits and impairments:  Abnormal gait, Decreased range of motion, Difficulty walking, Increased muscle spasms, Decreased activity tolerance, Pain, Decreased balance, Hypomobility, Impaired flexibility, Improper body mechanics, Postural dysfunction, Increased edema, Decreased strength, Decreased mobility  Visit Diagnosis: Muscle weakness (generalized)  Stiffness of left ankle, not elsewhere classified  Pain in left ankle and joints of left foot  Localized edema  Difficulty in walking, not elsewhere classified     Problem List There are no problems to display for this patient.   Lucresha Dismuke C. Swara Donze PT, DPT 05/16/21 12:27 PM   [facility] [address] [place], [place], [address] Phone: [phone]   Fax:  [phone]  Name: Mitchell Melendez MRN: [id number] Date of Birth: 08/05/1971

## 2021-05-20 ENCOUNTER — Other Ambulatory Visit: Payer: Self-pay

## 2021-05-20 ENCOUNTER — Ambulatory Visit (HOSPITAL_BASED_OUTPATIENT_CLINIC_OR_DEPARTMENT_OTHER): Payer: 59 | Admitting: Physical Therapy

## 2021-05-20 ENCOUNTER — Encounter (HOSPITAL_BASED_OUTPATIENT_CLINIC_OR_DEPARTMENT_OTHER): Payer: Self-pay | Admitting: Physical Therapy

## 2021-05-20 DIAGNOSIS — M25672 Stiffness of left ankle, not elsewhere classified: Secondary | ICD-10-CM | POA: Diagnosis not present

## 2021-05-20 DIAGNOSIS — R262 Difficulty in walking, not elsewhere classified: Secondary | ICD-10-CM | POA: Diagnosis not present

## 2021-05-20 DIAGNOSIS — M25572 Pain in left ankle and joints of left foot: Secondary | ICD-10-CM | POA: Diagnosis not present

## 2021-05-20 DIAGNOSIS — R600 Localized edema: Secondary | ICD-10-CM | POA: Diagnosis not present

## 2021-05-20 DIAGNOSIS — M6281 Muscle weakness (generalized): Secondary | ICD-10-CM

## 2021-05-20 NOTE — Therapy (Signed)
[place], [place], [address] Phone: [phone]   Fax:  [phone]  Physical Therapy Treatment  Patient Details  Name: Mitchell Melendez MRN: [id number] Date of Birth: 06/05/1971 Referring Provider (PT): Mechele Claude PA-C   Encounter Date: 05/20/2021   PT End of Session - 05/20/21 1607     Visit Number 16    Number of Visits 25    Date for PT Re-Evaluation 06/13/21    Authorization Type [facility]    PT Start Time 1605    PT Stop Time [id number]    PT Time Calculation (min) 38 min    Activity Tolerance Patient tolerated treatment well    Behavior During Therapy [facility] for tasks assessed/performed             Past Medical History:  Diagnosis Date   Seasonal allergies     Past Surgical History:  Procedure Laterality Date   ACHILLES TENDON REPAIR Left 02/04/2021   DENTAL SURGERY     VASECTOMY      There were no vitals filed for this visit.   Subjective Assessment - 05/20/21 1606     Subjective A little extra puffy today after sitting in meetings the last two days.    Patient Stated Goals golf, hike, run    Currently in Pain? No/denies                [facility] PT Assessment - 05/20/21 0001       AROM   Left Ankle Dorsiflexion 10      Strength   Overall Strength Comments able to demo 5 single leg heel raises- tends to bend knee slightly in lift to assist    Left Hip ABduction 4+/5      Palpation   Palpation comment scar tissue buildup at achilles is no longer tender                           [facility] Adult PT Treatment/Exercise - 05/20/21 0001       Manual Therapy   Edema Management Lt proximal ankle    Soft tissue mobilization roller to gastroc after toe walking exercises      Ankle Exercises: Standing   SLS single leg heel raises with chair support    Toe Walk (Round Trip) lateral and fwd stepping over hurdles    Other Standing Ankle Exercises hop switches in wall  plank    Other Standing Ankle Exercises wihtout shoes: marching, SLS, SLS+head turns, heel raises      Ankle Exercises: Stretches   Gastroc Stretch Limitations lunge stretch      Ankle Exercises: Machines for Strengthening   Cybex Leg Press shuttle: 2*25 2x15 SL leg press, 25+6 x15SL leg press, 25 hops on toes                       PT Short Term Goals - 04/15/21 2027       PT SHORT TERM GOAL #1   Title pt will be prepared with exercises, pain modulation techniques and supports for trip    Status Achieved      PT SHORT TERM GOAL #2   Title pt will demo heel toe gait with controlled knee extension    Status Achieved      PT SHORT TERM GOAL #3   Title pt will be independent in rice and mobility techniques throughout his days to control  edema    Status Achieved               PT Long Term Goals - 03/18/21 0655       PT LONG TERM GOAL #1   Title pt will tolerate torque on ankle with good stability to return to golf    Baseline unable at eval    Time 12    Period Weeks    Status New    Target Date 06/13/21      PT LONG TERM GOAL #2   Title pt will return to hiking with good ankle stability and awareness of postures/control    Baseline weakness and instability at eval    Time 12    Period Weeks    Status New    Target Date 06/13/21      PT LONG TERM GOAL #3   Title pt will demo good control and posture while performing light plyometric activities and running    Baseline unable at eval    Time 12    Period Weeks    Status New    Target Date 06/13/21      PT LONG TERM GOAL #4   Title bil LE strength to gross 5/5 for proximal stability for distal support    Baseline NT at eval    Time 12    Period Weeks    Status New    Target Date 06/13/21                   Plan - 05/20/21 1618     Clinical Impression Statement good control noted without shoes today. is able to begin to demo single leg heel raise strength and control. slightly  nervous about the take off and landing in plyometrics- will cont to use shuttle for training.    PT Treatment/Interventions ADLs/Self Care Home Management;Aquatic Therapy;Cryotherapy;Electrical Stimulation;Gait training;Moist Heat;Stair training;Functional mobility training;Therapeutic activities;Therapeutic exercise;Balance training;Neuromuscular re-education;Manual techniques;Patient/family education;Passive range of motion;Dry needling;Taping    PT Next Visit Plan cont jogging challenge as appropriate    PT Home Exercise Plan [id number], walking backward, ice    Consulted and Agree with Plan of Care Patient             Patient will benefit from skilled therapeutic intervention in order to improve the following deficits and impairments:  Abnormal gait, Decreased range of motion, Difficulty walking, Increased muscle spasms, Decreased activity tolerance, Pain, Decreased balance, Hypomobility, Impaired flexibility, Improper body mechanics, Postural dysfunction, Increased edema, Decreased strength, Decreased mobility  Visit Diagnosis: Muscle weakness (generalized)  Stiffness of left ankle, not elsewhere classified  Pain in left ankle and joints of left foot  Localized edema  Difficulty in walking, not elsewhere classified     Problem List There are no problems to display for this patient.  Glenn Christo C. Roman Dubuc PT, DPT 05/20/21 4:48 PM   [facility] [address] [place], [place], [address] Phone: [phone]   Fax:  [phone]  Name: Mitchell Melendez MRN: [id number] Date of Birth: [birth year]

## 2021-05-22 ENCOUNTER — Other Ambulatory Visit: Payer: Self-pay

## 2021-05-22 ENCOUNTER — Ambulatory Visit (HOSPITAL_BASED_OUTPATIENT_CLINIC_OR_DEPARTMENT_OTHER): Payer: 59 | Admitting: Physical Therapy

## 2021-05-22 ENCOUNTER — Encounter (HOSPITAL_BASED_OUTPATIENT_CLINIC_OR_DEPARTMENT_OTHER): Payer: Self-pay | Admitting: Physical Therapy

## 2021-05-22 DIAGNOSIS — M6281 Muscle weakness (generalized): Secondary | ICD-10-CM | POA: Diagnosis not present

## 2021-05-22 DIAGNOSIS — R600 Localized edema: Secondary | ICD-10-CM | POA: Diagnosis not present

## 2021-05-22 DIAGNOSIS — M25672 Stiffness of left ankle, not elsewhere classified: Secondary | ICD-10-CM

## 2021-05-22 DIAGNOSIS — R262 Difficulty in walking, not elsewhere classified: Secondary | ICD-10-CM | POA: Diagnosis not present

## 2021-05-22 DIAGNOSIS — M25572 Pain in left ankle and joints of left foot: Secondary | ICD-10-CM | POA: Diagnosis not present

## 2021-05-22 NOTE — Therapy (Signed)
[place], [place], [address] Phone: [phone]   Fax:  [phone]  Physical Therapy Treatment  Patient Details  Name: Mitchell Melendez MRN: [id number] Date of Birth: 07/22/1971 Referring Provider (PT): Mechele Claude PA-C   Encounter Date: 05/22/2021   PT End of Session - 05/22/21 1603     Visit Number 17    Number of Visits 25    Date for PT Re-Evaluation 06/13/21    Authorization Type [facility]    PT Start Time 1600    PT Stop Time [id number]    PT Time Calculation (min) 44 min    Activity Tolerance Patient tolerated treatment well    Behavior During Therapy [facility] for tasks assessed/performed             Past Medical History:  Diagnosis Date   Seasonal allergies     Past Surgical History:  Procedure Laterality Date   ACHILLES TENDON REPAIR Left 02/04/2021   DENTAL SURGERY     VASECTOMY      There were no vitals filed for this visit.   Subjective Assessment - 05/22/21 1604     Subjective puffy the last couple of days, really busy and doing a lot of sitting.    Patient Stated Goals golf, hike, run    Currently in Pain? No/denies                               [facility] Adult PT Treatment/Exercise - 05/22/21 0001       Manual Therapy   Soft tissue mobilization roller to gastroc end of session      Ankle Exercises: Aerobic   Stationary Bike 5 min L6      Ankle Exercises: Plyometrics   Plyometric Exercises jogging    Plyometric Exercises hop to alt legs with pause    Plyometric Exercises lateral shuffling      Ankle Exercises: Stretches   Other Stretch piriformis, hamstrings, slant board, quads      Ankle Exercises: Machines for Strengthening   Cybex Leg Press PF press machine- 20x 25lb bil, 15lb single x20                       PT Short Term Goals - 04/15/21 2027       PT SHORT TERM GOAL #1   Title pt will be prepared with exercises, pain  modulation techniques and supports for trip    Status Achieved      PT SHORT TERM GOAL #2   Title pt will demo heel toe gait with controlled knee extension    Status Achieved      PT SHORT TERM GOAL #3   Title pt will be independent in rice and mobility techniques throughout his days to control edema    Status Achieved               PT Long Term Goals - 03/18/21 0655       PT LONG TERM GOAL #1   Title pt will tolerate torque on ankle with good stability to return to golf    Baseline unable at eval    Time 12    Period Weeks    Status New    Target Date 06/13/21      PT LONG TERM GOAL #2   Title pt will return to hiking with good ankle stability and awareness of  postures/control    Baseline weakness and instability at eval    Time 12    Period Weeks    Status New    Target Date 06/13/21      PT LONG TERM GOAL #3   Title pt will demo good control and posture while performing light plyometric activities and running    Baseline unable at eval    Time 12    Period Weeks    Status New    Target Date 06/13/21      PT LONG TERM GOAL #4   Title bil LE strength to gross 5/5 for proximal stability for distal support    Baseline NT at eval    Time 12    Period Weeks    Status New    Target Date 06/13/21                   Plan - 05/22/21 1645     Clinical Impression Statement cont to incr plyometric challenges and pt demo good control with mild limitations- lateral rolling of Lt ankle in lateral shuffling to the Lt, limited pause in forward momentum when hopping to single leg on Lt side. Fatigue and soreness noted with knotting in lateral gastroc all without pain. Is OOT next week and will return in about a week.    PT Treatment/Interventions ADLs/Self Care Home Management;Aquatic Therapy;Cryotherapy;Electrical Stimulation;Gait training;Moist Heat;Stair training;Functional mobility training;Therapeutic activities;Therapeutic exercise;Balance  training;Neuromuscular re-education;Manual techniques;Patient/family education;Passive range of motion;Dry needling;Taping    PT Next Visit Plan outcome of plyometrics?    PT Home Exercise Plan [id number], walking backward, ice    Consulted and Agree with Plan of Care Patient             Patient will benefit from skilled therapeutic intervention in order to improve the following deficits and impairments:  Abnormal gait, Decreased range of motion, Difficulty walking, Increased muscle spasms, Decreased activity tolerance, Pain, Decreased balance, Hypomobility, Impaired flexibility, Improper body mechanics, Postural dysfunction, Increased edema, Decreased strength, Decreased mobility  Visit Diagnosis: Muscle weakness (generalized)  Stiffness of left ankle, not elsewhere classified  Pain in left ankle and joints of left foot  Localized edema  Difficulty in walking, not elsewhere classified     Problem List There are no problems to display for this patient.  Silvie Obremski C. Ahmiyah Coil PT, DPT 05/22/21 4:47 PM   [facility] [address] [place], [place], [address] Phone: [phone]   Fax:  [phone]  Name: Mitchell Melendez MRN: [id number] Date of Birth: 31-Jul-1971

## 2021-05-23 ENCOUNTER — Encounter (HOSPITAL_BASED_OUTPATIENT_CLINIC_OR_DEPARTMENT_OTHER): Payer: 59 | Admitting: Physical Therapy

## 2021-05-29 ENCOUNTER — Ambulatory Visit (HOSPITAL_BASED_OUTPATIENT_CLINIC_OR_DEPARTMENT_OTHER): Payer: 59 | Admitting: Physical Therapy

## 2021-05-29 ENCOUNTER — Other Ambulatory Visit: Payer: Self-pay

## 2021-05-29 ENCOUNTER — Encounter (HOSPITAL_BASED_OUTPATIENT_CLINIC_OR_DEPARTMENT_OTHER): Payer: Self-pay | Admitting: Physical Therapy

## 2021-05-29 DIAGNOSIS — R600 Localized edema: Secondary | ICD-10-CM | POA: Diagnosis not present

## 2021-05-29 DIAGNOSIS — M25572 Pain in left ankle and joints of left foot: Secondary | ICD-10-CM

## 2021-05-29 DIAGNOSIS — M25672 Stiffness of left ankle, not elsewhere classified: Secondary | ICD-10-CM | POA: Diagnosis not present

## 2021-05-29 DIAGNOSIS — M6281 Muscle weakness (generalized): Secondary | ICD-10-CM

## 2021-05-29 DIAGNOSIS — R262 Difficulty in walking, not elsewhere classified: Secondary | ICD-10-CM | POA: Diagnosis not present

## 2021-05-29 NOTE — Therapy (Signed)
[place], [place], [address] Phone: [phone]   Fax:  [phone]  Physical Therapy Treatment  Patient Details  Name: Mitchell Melendez MRN: [id number] Date of Birth: May 21, 1971 Referring Provider (PT): Mechele Claude PA-C   Encounter Date: 05/29/2021   PT End of Session - 05/29/21 1606     Visit Number 18    Number of Visits 25    Date for PT Re-Evaluation 06/13/21    Authorization Type [facility]    PT Start Time 1602    PT Stop Time 1640    PT Time Calculation (min) 38 min    Activity Tolerance Patient tolerated treatment well    Behavior During Therapy [facility] for tasks assessed/performed             Past Medical History:  Diagnosis Date   Seasonal allergies     Past Surgical History:  Procedure Laterality Date   ACHILLES TENDON REPAIR Left 02/04/2021   DENTAL SURGERY     VASECTOMY      There were no vitals filed for this visit.   Subjective Assessment - 05/29/21 1608     Subjective still have a hard time squatting down low. stiff after traveling.    Patient Stated Goals golf, hike, run    Currently in Pain? No/denies                               [facility] Adult PT Treatment/Exercise - 05/29/21 0001       Manual Therapy   Soft tissue mobilization roller to gastroc, trigger point release in lateral belly      Ankle Exercises: Plyometrics   Plyometric Exercises double leg hopping- smooth transition to squat    Plyometric Exercises jogging    Plyometric Exercises mountain climbers on bosu      Ankle Exercises: Standing   Rebounder SLS 3 directions    Other Standing Ankle Exercises static stance & squat on bosu                       PT Short Term Goals - 04/15/21 2027       PT SHORT TERM GOAL #1   Title pt will be prepared with exercises, pain modulation techniques and supports for trip    Status Achieved      PT SHORT TERM GOAL #2   Title pt  will demo heel toe gait with controlled knee extension    Status Achieved      PT SHORT TERM GOAL #3   Title pt will be independent in rice and mobility techniques throughout his days to control edema    Status Achieved               PT Long Term Goals - 03/18/21 0655       PT LONG TERM GOAL #1   Title pt will tolerate torque on ankle with good stability to return to golf    Baseline unable at eval    Time 12    Period Weeks    Status New    Target Date 06/13/21      PT LONG TERM GOAL #2   Title pt will return to hiking with good ankle stability and awareness of postures/control    Baseline weakness and instability at eval    Time 12    Period Weeks    Status New  Target Date 06/13/21      PT LONG TERM GOAL #3   Title pt will demo good control and posture while performing light plyometric activities and running    Baseline unable at eval    Time 12    Period Weeks    Status New    Target Date 06/13/21      PT LONG TERM GOAL #4   Title bil LE strength to gross 5/5 for proximal stability for distal support    Baseline NT at eval    Time 12    Period Weeks    Status New    Target Date 06/13/21                   Plan - 05/29/21 1654     Clinical Impression Statement Able to see coordinated muscle contraction for ankle stability rather than excessive inversion/eversion motion. Tends to shift weigth anteriorly and roll out on feet in jumping transitions to/from squat.    PT Treatment/Interventions ADLs/Self Care Home Management;Aquatic Therapy;Cryotherapy;Electrical Stimulation;Gait training;Moist Heat;Stair training;Functional mobility training;Therapeutic activities;Therapeutic exercise;Balance training;Neuromuscular re-education;Manual techniques;Patient/family education;Passive range of motion;Dry needling;Taping    PT Next Visit Plan cont to progress challenges    PT Home Exercise Plan [id number], walking backward, ice    Consulted and Agree with Plan  of Care Patient             Patient will benefit from skilled therapeutic intervention in order to improve the following deficits and impairments:  Abnormal gait, Decreased range of motion, Difficulty walking, Increased muscle spasms, Decreased activity tolerance, Pain, Decreased balance, Hypomobility, Impaired flexibility, Improper body mechanics, Postural dysfunction, Increased edema, Decreased strength, Decreased mobility  Visit Diagnosis: Muscle weakness (generalized)  Stiffness of left ankle, not elsewhere classified  Pain in left ankle and joints of left foot     Problem List There are no problems to display for this patient.   Tommaso Cavitt C. Alani Lacivita PT, DPT 05/29/21 4:56 PM   [facility] [address] [place], [place], [address] Phone: [phone]   Fax:  [phone]  Name: Mitchell Melendez MRN: [id number] Date of Birth: 24-Aug-1971

## 2021-06-03 ENCOUNTER — Ambulatory Visit (HOSPITAL_BASED_OUTPATIENT_CLINIC_OR_DEPARTMENT_OTHER): Payer: 59 | Admitting: Physical Therapy

## 2021-06-03 ENCOUNTER — Encounter (HOSPITAL_BASED_OUTPATIENT_CLINIC_OR_DEPARTMENT_OTHER): Payer: Self-pay | Admitting: Physical Therapy

## 2021-06-03 ENCOUNTER — Other Ambulatory Visit: Payer: Self-pay

## 2021-06-03 DIAGNOSIS — R262 Difficulty in walking, not elsewhere classified: Secondary | ICD-10-CM | POA: Diagnosis not present

## 2021-06-03 DIAGNOSIS — M25572 Pain in left ankle and joints of left foot: Secondary | ICD-10-CM | POA: Diagnosis not present

## 2021-06-03 DIAGNOSIS — R600 Localized edema: Secondary | ICD-10-CM | POA: Diagnosis not present

## 2021-06-03 DIAGNOSIS — M6281 Muscle weakness (generalized): Secondary | ICD-10-CM | POA: Diagnosis not present

## 2021-06-03 DIAGNOSIS — M25672 Stiffness of left ankle, not elsewhere classified: Secondary | ICD-10-CM | POA: Diagnosis not present

## 2021-06-03 NOTE — Therapy (Signed)
[place], [place], [address] Phone: [phone]   Fax:  [phone]  Physical Therapy Treatment  Patient Details  Name: Mitchell Melendez MRN: [id number] Date of Birth: [birth year] Referring Provider (PT): Mechele Claude PA-C   Encounter Date: 06/03/2021   PT End of Session - 06/03/21 1605     Visit Number 19    Number of Visits 25    Date for PT Re-Evaluation 06/13/21    Authorization Type [facility]    PT Start Time 1603    PT Stop Time 1642    PT Time Calculation (min) 39 min    Activity Tolerance Patient tolerated treatment well    Behavior During Therapy [facility] for tasks assessed/performed             Past Medical History:  Diagnosis Date   Seasonal allergies     Past Surgical History:  Procedure Laterality Date   ACHILLES TENDON REPAIR Left 02/04/2021   DENTAL SURGERY     VASECTOMY      There were no vitals filed for this visit.   Subjective Assessment - 06/03/21 1606     Subjective denied any pain or irritation after last appointment. jogged a little on sunday and the top of my foot hurt after. I put some ice on it for a few min and it calmed down.    Patient Stated Goals golf, hike, run    Currently in Pain? No/denies                [facility] PT Assessment - 06/03/21 0001       Strength   Overall Strength Comments able to stay up on toes in plyometric motions but lacking control at knee when in PF                           [facility] Adult PT Treatment/Exercise - 06/03/21 0001       Ankle Exercises: Machines for Strengthening   Cybex Leg Press shuttle: 3x25 double leg press neutral & ER, 25+6 alt single leg hop switch, 2*25+12 squats in PF      Ankle Exercises: Standing   SLS on airex, soft joint & deep bend      Ankle Exercises: Plyometrics   Plyometric Exercises ladder drills- fwd, back, lateral      Ankle Exercises: Sidelying   Other Sidelying Ankle  Exercises hip burner series: abd, hip flx/ext, arcs                       PT Short Term Goals - 04/15/21 2027       PT SHORT TERM GOAL #1   Title pt will be prepared with exercises, pain modulation techniques and supports for trip    Status Achieved      PT SHORT TERM GOAL #2   Title pt will demo heel toe gait with controlled knee extension    Status Achieved      PT SHORT TERM GOAL #3   Title pt will be independent in rice and mobility techniques throughout his days to control edema    Status Achieved               PT Long Term Goals - 03/18/21 4656       PT LONG TERM GOAL #1   Title pt will tolerate torque on ankle with good stability to return to golf  Baseline unable at eval    Time 12    Period Weeks    Status New    Target Date 06/13/21      PT LONG TERM GOAL #2   Title pt will return to hiking with good ankle stability and awareness of postures/control    Baseline weakness and instability at eval    Time 12    Period Weeks    Status New    Target Date 06/13/21      PT LONG TERM GOAL #3   Title pt will demo good control and posture while performing light plyometric activities and running    Baseline unable at eval    Time 12    Period Weeks    Status New    Target Date 06/13/21      PT LONG TERM GOAL #4   Title bil LE strength to gross 5/5 for proximal stability for distal support    Baseline NT at eval    Time 12    Period Weeks    Status New    Target Date 06/13/21                   Plan - 06/03/21 1645     Clinical Impression Statement dropping of Lt heel slightly in plyometrics. most notable that when walking in PF knee unlocks and uncontrolled return.    PT Treatment/Interventions ADLs/Self Care Home Management;Aquatic Therapy;Cryotherapy;Electrical Stimulation;Gait training;Moist Heat;Stair training;Functional mobility training;Therapeutic activities;Therapeutic exercise;Balance training;Neuromuscular  re-education;Manual techniques;Patient/family education;Passive range of motion;Dry needling;Taping    PT Home Exercise Plan [id number], walking backward, ice    Consulted and Agree with Plan of Care Patient             Patient will benefit from skilled therapeutic intervention in order to improve the following deficits and impairments:  Abnormal gait, Decreased range of motion, Difficulty walking, Increased muscle spasms, Decreased activity tolerance, Pain, Decreased balance, Hypomobility, Impaired flexibility, Improper body mechanics, Postural dysfunction, Increased edema, Decreased strength, Decreased mobility  Visit Diagnosis: Muscle weakness (generalized)  Stiffness of left ankle, not elsewhere classified  Pain in left ankle and joints of left foot  Localized edema  Difficulty in walking, not elsewhere classified     Problem List There are no problems to display for this patient.  Lillard Bailon C. Myran Arcia PT, DPT 06/03/21 4:47 PM   [facility] [address] [place], [place], [address] Phone: [phone]   Fax:  [phone]  Name: Mitchell Melendez MRN: [id number] Date of Birth: 12/22/1970

## 2021-06-05 ENCOUNTER — Ambulatory Visit (HOSPITAL_BASED_OUTPATIENT_CLINIC_OR_DEPARTMENT_OTHER): Payer: 59 | Admitting: Physical Therapy

## 2021-06-05 ENCOUNTER — Other Ambulatory Visit: Payer: Self-pay

## 2021-06-05 ENCOUNTER — Encounter (HOSPITAL_BASED_OUTPATIENT_CLINIC_OR_DEPARTMENT_OTHER): Payer: Self-pay | Admitting: Physical Therapy

## 2021-06-05 DIAGNOSIS — R262 Difficulty in walking, not elsewhere classified: Secondary | ICD-10-CM | POA: Diagnosis not present

## 2021-06-05 DIAGNOSIS — R600 Localized edema: Secondary | ICD-10-CM | POA: Diagnosis not present

## 2021-06-05 DIAGNOSIS — M6281 Muscle weakness (generalized): Secondary | ICD-10-CM

## 2021-06-05 DIAGNOSIS — M25572 Pain in left ankle and joints of left foot: Secondary | ICD-10-CM | POA: Diagnosis not present

## 2021-06-05 DIAGNOSIS — M25672 Stiffness of left ankle, not elsewhere classified: Secondary | ICD-10-CM

## 2021-06-05 NOTE — Therapy (Signed)
[place], [place], [address] Phone: [phone]   Fax:  [phone]  Physical Therapy Treatment  Patient Details  Name: Mitchell Melendez MRN: [id number] Date of Birth: 1971-03-01 Referring Provider (PT): Mechele Claude PA-C   Encounter Date: 06/05/2021   PT End of Session - 06/05/21 0845     Visit Number 20    Number of Visits 25    Date for PT Re-Evaluation 06/13/21    Authorization Type [facility]    PT Start Time 0843    PT Stop Time 0922    PT Time Calculation (min) 39 min    Activity Tolerance Patient tolerated treatment well    Behavior During Therapy [facility] for tasks assessed/performed             Past Medical History:  Diagnosis Date   Seasonal allergies     Past Surgical History:  Procedure Laterality Date   ACHILLES TENDON REPAIR Left 02/04/2021   DENTAL SURGERY     VASECTOMY      There were no vitals filed for this visit.   Subjective Assessment - 06/05/21 0845     Subjective A little tight but no pain/soreness.    Currently in Pain? No/denies                               [facility] Adult PT Treatment/Exercise - 06/05/21 0001       Manual Therapy   Soft tissue mobilization roller & TPR to lateral gastroc & peroneals      Ankle Exercises: Aerobic   Stationary Bike 4 min L4      Ankle Exercises: Standing   SLS trunk rotation taps on cones    Other Standing Ankle Exercises airex beam: tandem fwd/retro, lateral stepping & heel raises with heels off of side    Other Standing Ankle Exercises bosu: single leg squat to reach for floor      Ankle Exercises: Plyometrics   Plyometric Exercises single leg hopping with absorbed landing, single leg box jumping      Ankle Exercises: Stretches   Gastroc Stretch Limitations standing lunge throughout session    Other Stretch slant board 2x30                       PT Short Term Goals - 04/15/21 2027        PT SHORT TERM GOAL #1   Title pt will be prepared with exercises, pain modulation techniques and supports for trip    Status Achieved      PT SHORT TERM GOAL #2   Title pt will demo heel toe gait with controlled knee extension    Status Achieved      PT SHORT TERM GOAL #3   Title pt will be independent in rice and mobility techniques throughout his days to control edema    Status Achieved               PT Long Term Goals - 03/18/21 0655       PT LONG TERM GOAL #1   Title pt will tolerate torque on ankle with good stability to return to golf    Baseline unable at eval    Time 12    Period Weeks    Status New    Target Date 06/13/21      PT LONG TERM GOAL #2   Title  pt will return to hiking with good ankle stability and awareness of postures/control    Baseline weakness and instability at eval    Time 12    Period Weeks    Status New    Target Date 06/13/21      PT LONG TERM GOAL #3   Title pt will demo good control and posture while performing light plyometric activities and running    Baseline unable at eval    Time 12    Period Weeks    Status New    Target Date 06/13/21      PT LONG TERM GOAL #4   Title bil LE strength to gross 5/5 for proximal stability for distal support    Baseline NT at eval    Time 12    Period Weeks    Status New    Target Date 06/13/21                   Plan - 06/05/21 0924     Clinical Impression Statement Fatigue in exercises and notable knot in lateral gastroc creating discomfort. Good flexibility with demo form for proper squat to touch fingers to the floor- tightness noted but springy end feel.    PT Treatment/Interventions ADLs/Self Care Home Management;Aquatic Therapy;Cryotherapy;Electrical Stimulation;Gait training;Moist Heat;Stair training;Functional mobility training;Therapeutic activities;Therapeutic exercise;Balance training;Neuromuscular re-education;Manual techniques;Patient/family education;Passive range  of motion;Dry needling;Taping    PT Next Visit Plan wrap up HEP    PT Home Exercise Plan [id number], walking backward, ice    Consulted and Agree with Plan of Care Patient             Patient will benefit from skilled therapeutic intervention in order to improve the following deficits and impairments:  Abnormal gait, Decreased range of motion, Difficulty walking, Increased muscle spasms, Decreased activity tolerance, Pain, Decreased balance, Hypomobility, Impaired flexibility, Improper body mechanics, Postural dysfunction, Increased edema, Decreased strength, Decreased mobility  Visit Diagnosis: Muscle weakness (generalized)  Stiffness of left ankle, not elsewhere classified  Pain in left ankle and joints of left foot  Localized edema  Difficulty in walking, not elsewhere classified     Problem List There are no problems to display for this patient.  Marlyss Cissell C. Donyel Nester PT, DPT 06/05/21 9:26 AM   [facility] [address] [place], [place], [address] Phone: [phone]   Fax:  [phone]  Name: Mitchell Melendez MRN: [id number] Date of Birth: Mar 16, 1971

## 2021-06-10 ENCOUNTER — Encounter (HOSPITAL_BASED_OUTPATIENT_CLINIC_OR_DEPARTMENT_OTHER): Payer: Self-pay | Admitting: Physical Therapy

## 2021-06-10 ENCOUNTER — Other Ambulatory Visit: Payer: Self-pay

## 2021-06-10 ENCOUNTER — Ambulatory Visit (HOSPITAL_BASED_OUTPATIENT_CLINIC_OR_DEPARTMENT_OTHER): Payer: 59 | Attending: Student | Admitting: Physical Therapy

## 2021-06-10 DIAGNOSIS — M25672 Stiffness of left ankle, not elsewhere classified: Secondary | ICD-10-CM | POA: Diagnosis not present

## 2021-06-10 DIAGNOSIS — R600 Localized edema: Secondary | ICD-10-CM | POA: Insufficient documentation

## 2021-06-10 DIAGNOSIS — M6281 Muscle weakness (generalized): Secondary | ICD-10-CM | POA: Diagnosis not present

## 2021-06-10 DIAGNOSIS — M25572 Pain in left ankle and joints of left foot: Secondary | ICD-10-CM | POA: Insufficient documentation

## 2021-06-10 DIAGNOSIS — R262 Difficulty in walking, not elsewhere classified: Secondary | ICD-10-CM | POA: Insufficient documentation

## 2021-06-11 ENCOUNTER — Encounter (HOSPITAL_BASED_OUTPATIENT_CLINIC_OR_DEPARTMENT_OTHER): Payer: Self-pay | Admitting: Physical Therapy

## 2021-06-11 NOTE — Therapy (Signed)
[place] [address] [place], [place], [address] Phone: [phone]   Fax:  [phone]  Physical Therapy Treatment  Patient Details  Name: MY MADARIAGA MRN: [id number] Date of Birth: Mar 12, 1971 Referring Provider (PT): Mechele Claude PA-C   Encounter Date: 06/10/2021   PT End of Session - 06/10/21 1619     Visit Number 21    Number of Visits 25    Date for PT Re-Evaluation 06/13/21    Authorization Type [facility] UMR    PT Start Time 1619    PT Stop Time 1654    PT Time Calculation (min) 35 min    Activity Tolerance Patient tolerated treatment well    Behavior During Therapy [facility] for tasks assessed/performed             Past Medical History:  Diagnosis Date   Seasonal allergies     Past Surgical History:  Procedure Laterality Date   ACHILLES TENDON REPAIR Left 02/04/2021   DENTAL SURGERY     VASECTOMY      There were no vitals filed for this visit.   Subjective Assessment - 06/10/21 1620     Subjective played golf this weekend and did well. jumping exercises are hard    Patient Stated Goals golf, hike, run    Currently in Pain? No/denies                               [facility] Adult PT Treatment/Exercise - 06/11/21 0001       Manual Therapy   Soft tissue mobilization IASTM to gastroc, scar tissue mobs at distal achilles      Ankle Exercises: Plyometrics   Plyometric Exercises single leg tripple hop    Plyometric Exercises lateral hops    Plyometric Exercises suicide runs      Ankle Exercises: Standing   SLS on airex- hinge to touch floor, opp LE ABCs    Other Standing Ankle Exercises plyometric step tap switches                       PT Short Term Goals - 04/15/21 2027       PT SHORT TERM GOAL #1   Title pt will be prepared with exercises, pain modulation techniques and supports for trip    Status Achieved      PT SHORT TERM GOAL #2   Title pt will demo heel  toe gait with controlled knee extension    Status Achieved      PT SHORT TERM GOAL #3   Title pt will be independent in rice and mobility techniques throughout his days to control edema    Status Achieved               PT Long Term Goals - 03/18/21 0655       PT LONG TERM GOAL #1   Title pt will tolerate torque on ankle with good stability to return to golf    Baseline unable at eval    Time 12    Period Weeks    Status New    Target Date 06/13/21      PT LONG TERM GOAL #2   Title pt will return to hiking with good ankle stability and awareness of postures/control    Baseline weakness and instability at eval    Time 12    Period Weeks    Status New  Target Date 06/13/21      PT LONG TERM GOAL #3   Title pt will demo good control and posture while performing light plyometric activities and running    Baseline unable at eval    Time 12    Period Weeks    Status New    Target Date 06/13/21      PT LONG TERM GOAL #4   Title bil LE strength to gross 5/5 for proximal stability for distal support    Baseline NT at eval    Time 12    Period Weeks    Status New    Target Date 06/13/21                   Plan - 06/11/21 0826     Clinical Impression Statement Tends to fall to flat foot on Lt side in plyometric motions due to fatigue but control is significantly improving. no longer has uncontrolled knee extension with gastroc engaged.    PT Treatment/Interventions ADLs/Self Care Home Management;Aquatic Therapy;Cryotherapy;Electrical Stimulation;Gait training;Moist Heat;Stair training;Functional mobility training;Therapeutic activities;Therapeutic exercise;Balance training;Neuromuscular re-education;Manual techniques;Patient/family education;Passive range of motion;Dry needling;Taping    PT Next Visit Plan d/c    PT Home Exercise Plan [id number], walking backward, ice    Consulted and Agree with Plan of Care Patient             Patient will benefit from  skilled therapeutic intervention in order to improve the following deficits and impairments:  Abnormal gait, Decreased range of motion, Difficulty walking, Increased muscle spasms, Decreased activity tolerance, Pain, Decreased balance, Hypomobility, Impaired flexibility, Improper body mechanics, Postural dysfunction, Increased edema, Decreased strength, Decreased mobility  Visit Diagnosis: Muscle weakness (generalized)  Stiffness of left ankle, not elsewhere classified  Pain in left ankle and joints of left foot  Localized edema  Difficulty in walking, not elsewhere classified     Problem List There are no problems to display for this patient.  Mariusz Jubb C. Mlissa Tamayo PT, DPT 06/11/21 8:31 AM   [facility] [place], [place], [address] Phone: [phone]   Fax:  [phone]  Name: ANSELM AUMILLER MRN: [id number] Date of Birth: 19-May-1971

## 2021-06-13 ENCOUNTER — Other Ambulatory Visit: Payer: Self-pay

## 2021-06-13 ENCOUNTER — Other Ambulatory Visit (HOSPITAL_BASED_OUTPATIENT_CLINIC_OR_DEPARTMENT_OTHER): Payer: Self-pay

## 2021-06-13 ENCOUNTER — Encounter (HOSPITAL_BASED_OUTPATIENT_CLINIC_OR_DEPARTMENT_OTHER): Payer: Self-pay | Admitting: Physical Therapy

## 2021-06-13 ENCOUNTER — Ambulatory Visit (HOSPITAL_BASED_OUTPATIENT_CLINIC_OR_DEPARTMENT_OTHER): Payer: 59 | Admitting: Physical Therapy

## 2021-06-13 DIAGNOSIS — R600 Localized edema: Secondary | ICD-10-CM | POA: Diagnosis not present

## 2021-06-13 DIAGNOSIS — R262 Difficulty in walking, not elsewhere classified: Secondary | ICD-10-CM | POA: Diagnosis not present

## 2021-06-13 DIAGNOSIS — M25572 Pain in left ankle and joints of left foot: Secondary | ICD-10-CM

## 2021-06-13 DIAGNOSIS — M25672 Stiffness of left ankle, not elsewhere classified: Secondary | ICD-10-CM

## 2021-06-13 DIAGNOSIS — M6281 Muscle weakness (generalized): Secondary | ICD-10-CM

## 2021-06-13 MED ORDER — INFLUENZA VAC SPLIT QUAD 0.5 ML IM SUSY
PREFILLED_SYRINGE | INTRAMUSCULAR | 0 refills | Status: DC
  Filled 2021-06-13: qty 0.5, 1d supply, fill #0

## 2021-06-13 NOTE — Therapy (Signed)
[place], [place], [address] Phone: [phone]   Fax:  [phone]  Physical Therapy Treatment/Discharge  Patient Details  Name: Mitchell Melendez MRN: [id number] Date of Birth: 1970-12-15 Referring Provider (PT): Mechele Claude PA-C   Encounter Date: 06/13/2021   PT End of Session - 06/13/21 0846     Visit Number 22    Number of Visits 25    Date for PT Re-Evaluation 06/13/21    Authorization Type [facility] UMR    PT Start Time 0845    PT Stop Time 0901    PT Time Calculation (min) 16 min    Activity Tolerance Patient tolerated treatment well    Behavior During Therapy [facility] for tasks assessed/performed             Past Medical History:  Diagnosis Date   Seasonal allergies     Past Surgical History:  Procedure Laterality Date   ACHILLES TENDON REPAIR Left 02/04/2021   DENTAL SURGERY     VASECTOMY      There were no vitals filed for this visit.   Subjective Assessment - 06/13/21 0920     Subjective It is feeling pretty good. A little morning stifness today.    Patient Stated Goals golf, hike, run    Currently in Pain? No/denies                [facility] PT Assessment - 06/13/21 0001       Assessment   Medical Diagnosis s/p Lt achilles tendon repair    Referring Provider (PT) Mechele Claude PA-C      Strength   Overall Strength Comments hip gross 5/5    Left Ankle Dorsiflexion 5/5                           [facility] Adult PT Treatment/Exercise - 06/13/21 0001       Ankle Exercises: Standing   SLS UE ABCs    Other Standing Ankle Exercises plyometric hopping                     PT Education - 06/13/21 0921     Education Details importance of continued HEP, prog plyometrics, pain v soreness & when to take a step back    Person(s) Educated Patient    Methods Explanation    Comprehension Verbalized understanding              PT Short Term Goals -  04/15/21 2027       PT SHORT TERM GOAL #1   Title pt will be prepared with exercises, pain modulation techniques and supports for trip    Status Achieved      PT SHORT TERM GOAL #2   Title pt will demo heel toe gait with controlled knee extension    Status Achieved      PT SHORT TERM GOAL #3   Title pt will be independent in rice and mobility techniques throughout his days to control edema    Status Achieved               PT Long Term Goals - 06/13/21 0847       PT LONG TERM GOAL #1   Title pt will tolerate torque on ankle with good stability to return to golf    Status Achieved      PT LONG TERM GOAL #2   Title pt will return to  hiking with good ankle stability and awareness of postures/control    Status Achieved      PT LONG TERM GOAL #3   Title pt will demo good control and posture while performing light plyometric activities and running    Status Achieved      PT LONG TERM GOAL #4   Title bil LE strength to gross 5/5 for proximal stability for distal support    Status Achieved                   Plan - 06/13/21 0922     Clinical Impression Statement Pt has met all of his goals at this time and is prepared for d/c to independent program. Encouraged him to reach out to me with any further questions.    PT Treatment/Interventions ADLs/Self Care Home Management;Aquatic Therapy;Cryotherapy;Electrical Stimulation;Gait training;Moist Heat;Stair training;Functional mobility training;Therapeutic activities;Therapeutic exercise;Balance training;Neuromuscular re-education;Manual techniques;Patient/family education;Passive range of motion;Dry needling;Taping    PT Home Exercise Plan [id number], walking backward, ice    Consulted and Agree with Plan of Care Patient             Patient will benefit from skilled therapeutic intervention in order to improve the following deficits and impairments:  Abnormal gait, Decreased range of motion, Difficulty walking,  Increased muscle spasms, Decreased activity tolerance, Pain, Decreased balance, Hypomobility, Impaired flexibility, Improper body mechanics, Postural dysfunction, Increased edema, Decreased strength, Decreased mobility  Visit Diagnosis: Muscle weakness (generalized)  Stiffness of left ankle, not elsewhere classified  Pain in left ankle and joints of left foot  Localized edema  Difficulty in walking, not elsewhere classified     Problem List There are no problems to display for this patient.  PHYSICAL THERAPY DISCHARGE SUMMARY  Visits from Start of Care: 22  Current functional level related to goals / functional outcomes: See above   Remaining deficits: See above   Education / Equipment: Anatomy of condition, POC, HEP, exercise form/rationale   Patient agrees to discharge. Patient goals were met. Patient is being discharged due to meeting the stated rehab goals. Kaena Santori C. Rosa Wyly PT, DPT 06/13/21 9:23 AM   [facility] [place], [place], [address] Phone: [phone]   Fax:  [phone]  Name: Mitchell Melendez MRN: [id number] Date of Birth: [birth year]

## 2021-06-17 ENCOUNTER — Encounter (HOSPITAL_BASED_OUTPATIENT_CLINIC_OR_DEPARTMENT_OTHER): Payer: 59 | Admitting: Physical Therapy

## 2021-06-19 DIAGNOSIS — D225 Melanocytic nevi of trunk: Secondary | ICD-10-CM | POA: Diagnosis not present

## 2021-06-19 DIAGNOSIS — L570 Actinic keratosis: Secondary | ICD-10-CM | POA: Diagnosis not present

## 2021-06-19 DIAGNOSIS — D2372 Other benign neoplasm of skin of left lower limb, including hip: Secondary | ICD-10-CM | POA: Diagnosis not present

## 2021-06-19 DIAGNOSIS — D2371 Other benign neoplasm of skin of right lower limb, including hip: Secondary | ICD-10-CM | POA: Diagnosis not present

## 2021-06-19 DIAGNOSIS — L820 Inflamed seborrheic keratosis: Secondary | ICD-10-CM | POA: Diagnosis not present

## 2021-06-19 DIAGNOSIS — D1801 Hemangioma of skin and subcutaneous tissue: Secondary | ICD-10-CM | POA: Diagnosis not present

## 2021-06-19 DIAGNOSIS — Z85828 Personal history of other malignant neoplasm of skin: Secondary | ICD-10-CM | POA: Diagnosis not present

## 2021-06-20 ENCOUNTER — Encounter (HOSPITAL_BASED_OUTPATIENT_CLINIC_OR_DEPARTMENT_OTHER): Payer: 59 | Admitting: Physical Therapy

## 2021-06-24 ENCOUNTER — Encounter (HOSPITAL_BASED_OUTPATIENT_CLINIC_OR_DEPARTMENT_OTHER): Payer: 59 | Admitting: Physical Therapy

## 2021-06-27 ENCOUNTER — Encounter (HOSPITAL_BASED_OUTPATIENT_CLINIC_OR_DEPARTMENT_OTHER): Payer: 59 | Admitting: Physical Therapy

## 2021-08-12 ENCOUNTER — Ambulatory Visit: Payer: 59 | Attending: Internal Medicine

## 2021-08-12 ENCOUNTER — Other Ambulatory Visit (HOSPITAL_BASED_OUTPATIENT_CLINIC_OR_DEPARTMENT_OTHER): Payer: Self-pay

## 2021-08-12 DIAGNOSIS — Z23 Encounter for immunization: Secondary | ICD-10-CM

## 2021-08-12 MED ORDER — PFIZER COVID-19 VAC BIVALENT 30 MCG/0.3ML IM SUSP
INTRAMUSCULAR | 0 refills | Status: DC
  Filled 2021-08-12: qty 0.3, 1d supply, fill #0

## 2021-08-12 NOTE — Progress Notes (Signed)
   Covid-19 Vaccination Clinic  Name:  SAYAN ALDAVA    MRN: [id number] DOB: [birth year]  08/12/2021  Mr. Vantine was observed post Covid-19 immunization for 15 minutes without incident. He was provided with Vaccine Information Sheet and instruction to access the V-Safe system.   Mr. Maslowski was instructed to call 911 with any severe reactions post vaccine: Difficulty breathing  Swelling of face and throat  A fast heartbeat  A bad rash all over body  Dizziness and weakness   Immunizations Administered     Name Date Dose VIS Date Route   Pfizer Covid-19 Vaccine Bivalent Booster 08/12/2021  9:44 AM 0.3 mL 05/07/2021 Intramuscular   Manufacturer: [place]   Lot: [id number]   [place]: [phone]

## 2022-05-02 ENCOUNTER — Other Ambulatory Visit (HOSPITAL_COMMUNITY): Payer: Self-pay

## 2022-05-02 ENCOUNTER — Telehealth: Payer: 59 | Admitting: Nurse Practitioner

## 2022-05-02 DIAGNOSIS — U071 COVID-19: Secondary | ICD-10-CM

## 2022-05-02 MED ORDER — NIRMATRELVIR/RITONAVIR (PAXLOVID)TABLET
3.0000 | ORAL_TABLET | Freq: Two times a day (BID) | ORAL | 0 refills | Status: AC
  Filled 2022-05-02: qty 30, 5d supply, fill #0

## 2022-05-02 NOTE — Progress Notes (Signed)
Virtual Visit Consent   Mitchell Melendez, you are scheduled for a virtual visit with a [facility] provider today. Just as with appointments in the office, your consent must be obtained to participate. Your consent will be active for this visit and any virtual visit you may have with one of our providers in the next 365 days. If you have a MyChart account, a copy of this consent can be sent to you electronically.  As this is a virtual visit, video technology does not allow for your provider to perform a traditional examination. This may limit your provider's ability to fully assess your condition. If your provider identifies any concerns that need to be evaluated in person or the need to arrange testing (such as labs, EKG, etc.), we will make arrangements to do so. Although advances in technology are sophisticated, we cannot ensure that it will always work on either your end or our end. If the connection with a video visit is poor, the visit may have to be switched to a telephone visit. With either a video or telephone visit, we are not always able to ensure that we have a secure connection.  By engaging in this virtual visit, you consent to the provision of healthcare and authorize for your insurance to be billed (if applicable) for the services provided during this visit. Depending on your insurance coverage, you may receive a charge related to this service.  I need to obtain your verbal consent now. Are you willing to proceed with your visit today? Mitchell Melendez has provided verbal consent on 05/02/2022 for a virtual visit (video or telephone). Gildardo Pounds, NP  Date: 05/02/2022 9:42 AM  Virtual Visit via Video Note   I, Gildardo Pounds, connected with  Mitchell Melendez  ([id number], 23-May-1971) on 05/02/22 at  9:15 AM EDT by a video-enabled telemedicine application and verified that I am speaking with the correct person using two identifiers.  Location: Patient: Virtual Visit Location Patient:  Home Provider: Virtual Visit Location Provider: Home Office   I discussed the limitations of evaluation and management by telemedicine and the availability of in person appointments. The patient expressed understanding and agreed to proceed.    History of Present Illness: Mitchell Melendez is a 51 y.o. who identifies as a male who was assigned male at birth, and is being seen today for [place].  Started feeling bad yesterday afternoon and symptoms progressively worsened overnight. Currently experiencing fever, malaise, myalgias, headache, nasal and chest congestion. Spouse tested positive for COVID 3 days ago as well.   Problems: There are no problems to display for this patient.   Allergies: No Known Allergies Medications:  Current Outpatient Medications:    nirmatrelvir/ritonavir EUA (PAXLOVID) 20 x 150 MG & 10 x '100MG'$  TABS, Take 3 tablets by mouth 2 (two) times daily for 5 days. (Take nirmatrelvir 150 mg two tablets twice daily for 5 days and ritonavir 100 mg one tablet twice daily for 5 days) Patient GFR is 103, Disp: 30 tablet, Rfl: 0   aspirin 81 MG EC tablet, Take 1 tablet by mouth twice a day, Disp: 30 tablet, Rfl: 0   COVID-19 mRNA bivalent vaccine, Pfizer, (PFIZER COVID-19 VAC BIVALENT) injection, Inject into the muscle., Disp: 0.3 mL, Rfl: 0   COVID-19 mRNA Vac-TriS, Pfizer, (PFIZER-BIONT COVID-19 VAC-TRIS) SUSP injection, Inject into the muscle., Disp: 0.3 mL, Rfl: 0   EPINEPHrine (ADRENALIN) 0.1 % nasal solution, Place 1 drop into the nose once.  (Patient  not taking: Reported on 03/17/2021), Disp: , Rfl:    EPINEPHrine 0.3 mg/0.3 mL IJ SOAJ injection, Inject 0.3 mLs (0.3 mg total) into the muscle once. (Patient not taking: Reported on 03/17/2021), Disp: 1 Device, Rfl: 0   Fexofenadine HCl (ALLEGRA PO), Take by mouth 1 day or 1 dose. (Patient not taking: Reported on 03/17/2021), Disp: , Rfl:    ibuprofen (ADVIL,MOTRIN) 600 MG tablet, Take 600 mg by mouth every 6 (six) hours as  needed for mild pain. (Patient not taking: Reported on 03/17/2021), Disp: , Rfl:    influenza vac split quadrivalent PF (FLUARIX) 0.5 ML injection, Inject into the muscle., Disp: 0.5 mL, Rfl: 0   montelukast (SINGULAIR) 4 MG chewable tablet, Chew 4 mg by mouth at bedtime. (Patient not taking: Reported on 03/17/2021), Disp: , Rfl:    oxyCODONE (OXY IR/ROXICODONE) 5 MG immediate release tablet, Take 1 tablet by mouth every 4 hours  as needed for 3 days. (Patient not taking: Reported on 03/17/2021), Disp: 18 tablet, Rfl: 0  Observations/Objective: Patient is well-developed, well-nourished in no acute distress.  Resting comfortably  at home.  Head is normocephalic, atraumatic.  No labored breathing.  Speech is clear and coherent with logical content.  Patient is alert and oriented at baseline.    Assessment and Plan: 1. Positive self-administered antigen test for COVID-19 - nirmatrelvir/ritonavir EUA (PAXLOVID) 20 x 150 MG & 10 x '100MG'$  TABS; Take 3 tablets by mouth 2 (two) times daily for 5 days. (Take nirmatrelvir 150 mg two tablets twice daily for 5 days and ritonavir 100 mg one tablet twice daily for 5 days) Patient GFR is 103  Dispense: 30 tablet; Refill: 0 INSTRUCTIONS: use a humidifier for nasal congestion Drink plenty of fluids, rest and wash hands frequently to avoid the spread of infection Alternate tylenol and Motrin for relief of fever   Follow Up Instructions: I discussed the assessment and treatment plan with the patient. The patient was provided an opportunity to ask questions and all were answered. The patient agreed with the plan and demonstrated an understanding of the instructions.  A copy of instructions were sent to the patient via MyChart unless otherwise noted below.    The patient was advised to call back or seek an in-person evaluation if the symptoms worsen or if the condition fails to improve as anticipated.  Time:  I spent 11 minutes with the patient via telehealth  technology discussing the above problems/concerns as well as review of pertinent health history and chart review.    Gildardo Pounds, NP

## 2022-05-02 NOTE — Patient Instructions (Signed)
If you test positive, whether you have symptoms or not Regardless of your vaccination status or infection history:  Isolate for at least 5 days Sleep and stay in a separate room from those not infected Use a separate bathroom if you can Wear a mask around others, even at home  You can end isolation early, after Day 5, if: You have no fever for 24 hours without taking fever-reducing medication, AND Your other symptoms are gone or improving  If you still have a fever, continue to isolate until the fever is gone for at least 24 hours If other symptoms are not improving, continue to isolate through Day 10  After you end isolation: Wear a mask around others for 10 full days after start of symptoms. If you had no symptoms, wear a mask for 10 full days after your positive test. You may remove your mask sooner than Day 10 if you have two negative tests in a row, at least one day apart.   Mitchell Melendez, thank you for joining Gildardo Pounds, NP for today's virtual visit.  While this provider is not your primary care provider (PCP), if your PCP is located in our provider database this encounter information will be shared with them immediately following your visit.  Consent: (Patient) Mitchell Melendez provided verbal consent for this virtual visit at the beginning of the encounter.  Current Medications:  Current Outpatient Medications:    nirmatrelvir/ritonavir EUA (PAXLOVID) 20 x 150 MG & 10 x '100MG'$  TABS, Take 3 tablets by mouth 2 (two) times daily for 5 days. (Take nirmatrelvir 150 mg two tablets twice daily for 5 days and ritonavir 100 mg one tablet twice daily for 5 days) Patient GFR is 103, Disp: 30 tablet, Rfl: 0   aspirin 81 MG EC tablet, Take 1 tablet by mouth twice a day, Disp: 30 tablet, Rfl: 0   COVID-19 mRNA bivalent vaccine, Pfizer, (PFIZER COVID-19 VAC BIVALENT) injection, Inject into the muscle., Disp: 0.3 mL, Rfl: 0   COVID-19 mRNA Vac-TriS, Pfizer, (PFIZER-BIONT COVID-19  VAC-TRIS) SUSP injection, Inject into the muscle., Disp: 0.3 mL, Rfl: 0   EPINEPHrine (ADRENALIN) 0.1 % nasal solution, Place 1 drop into the nose once.  (Patient not taking: Reported on 03/17/2021), Disp: , Rfl:    EPINEPHrine 0.3 mg/0.3 mL IJ SOAJ injection, Inject 0.3 mLs (0.3 mg total) into the muscle once. (Patient not taking: Reported on 03/17/2021), Disp: 1 Device, Rfl: 0   Fexofenadine HCl (ALLEGRA PO), Take by mouth 1 day or 1 dose. (Patient not taking: Reported on 03/17/2021), Disp: , Rfl:    ibuprofen (ADVIL,MOTRIN) 600 MG tablet, Take 600 mg by mouth every 6 (six) hours as needed for mild pain. (Patient not taking: Reported on 03/17/2021), Disp: , Rfl:    influenza vac split quadrivalent PF (FLUARIX) 0.5 ML injection, Inject into the muscle., Disp: 0.5 mL, Rfl: 0   montelukast (SINGULAIR) 4 MG chewable tablet, Chew 4 mg by mouth at bedtime. (Patient not taking: Reported on 03/17/2021), Disp: , Rfl:    oxyCODONE (OXY IR/ROXICODONE) 5 MG immediate release tablet, Take 1 tablet by mouth every 4 hours  as needed for 3 days. (Patient not taking: Reported on 03/17/2021), Disp: 18 tablet, Rfl: 0   Medications ordered in this encounter:  Meds ordered this encounter  Medications   nirmatrelvir/ritonavir EUA (PAXLOVID) 20 x 150 MG & 10 x '100MG'$  TABS    Sig: Take 3 tablets by mouth 2 (two) times daily for 5 days. (Take  nirmatrelvir 150 mg two tablets twice daily for 5 days and ritonavir 100 mg one tablet twice daily for 5 days) Patient GFR is 103    Dispense:  30 tablet    Refill:  0    Order Specific Question:   Supervising Provider    Answer:   Noemi Chapel [[id number]]     *If you need refills on other medications prior to your next appointment, please contact your pharmacy*  Follow-Up: Call back or seek an in-person evaluation if the symptoms worsen or if the condition fails to improve as anticipated.  Other Instructions SEE ABOVE   If you have been instructed to have an in-person evaluation  today at a local Urgent Care facility, please use the link below. It will take you to a list of all of our available [facility], including address, phone number and hours of operation. Please do not delay care.  [facility]  If you or a family member do not have a primary care provider, use the link below to schedule a visit and establish care. When you choose a [facility] primary care physician or advanced practice provider, you gain a long-term partner in health. Find a Primary Care Provider  Learn more about [facility]'s in-office and virtual care options: [facility]

## 2022-05-12 DIAGNOSIS — H524 Presbyopia: Secondary | ICD-10-CM | POA: Diagnosis not present

## 2022-05-12 DIAGNOSIS — H52223 Regular astigmatism, bilateral: Secondary | ICD-10-CM | POA: Diagnosis not present

## 2022-05-12 DIAGNOSIS — H5213 Myopia, bilateral: Secondary | ICD-10-CM | POA: Diagnosis not present

## 2022-06-25 DIAGNOSIS — Z85828 Personal history of other malignant neoplasm of skin: Secondary | ICD-10-CM | POA: Diagnosis not present

## 2022-06-25 DIAGNOSIS — J309 Allergic rhinitis, unspecified: Secondary | ICD-10-CM | POA: Diagnosis not present

## 2022-06-25 DIAGNOSIS — E7800 Pure hypercholesterolemia, unspecified: Secondary | ICD-10-CM | POA: Diagnosis not present

## 2022-06-25 DIAGNOSIS — M7701 Medial epicondylitis, right elbow: Secondary | ICD-10-CM | POA: Diagnosis not present

## 2022-06-25 DIAGNOSIS — M545 Low back pain, unspecified: Secondary | ICD-10-CM | POA: Diagnosis not present

## 2022-06-25 DIAGNOSIS — Z8249 Family history of ischemic heart disease and other diseases of the circulatory system: Secondary | ICD-10-CM | POA: Diagnosis not present

## 2022-06-25 DIAGNOSIS — R7303 Prediabetes: Secondary | ICD-10-CM | POA: Diagnosis not present

## 2022-06-25 DIAGNOSIS — Z0000 Encounter for general adult medical examination without abnormal findings: Secondary | ICD-10-CM | POA: Diagnosis not present

## 2022-06-25 DIAGNOSIS — Z125 Encounter for screening for malignant neoplasm of prostate: Secondary | ICD-10-CM | POA: Diagnosis not present

## 2022-07-01 ENCOUNTER — Other Ambulatory Visit (HOSPITAL_COMMUNITY): Payer: Self-pay

## 2022-07-01 DIAGNOSIS — D225 Melanocytic nevi of trunk: Secondary | ICD-10-CM | POA: Diagnosis not present

## 2022-07-01 DIAGNOSIS — L814 Other melanin hyperpigmentation: Secondary | ICD-10-CM | POA: Diagnosis not present

## 2022-07-01 DIAGNOSIS — Z85828 Personal history of other malignant neoplasm of skin: Secondary | ICD-10-CM | POA: Diagnosis not present

## 2022-07-01 DIAGNOSIS — D2372 Other benign neoplasm of skin of left lower limb, including hip: Secondary | ICD-10-CM | POA: Diagnosis not present

## 2022-07-01 DIAGNOSIS — D2262 Melanocytic nevi of left upper limb, including shoulder: Secondary | ICD-10-CM | POA: Diagnosis not present

## 2022-07-01 DIAGNOSIS — L821 Other seborrheic keratosis: Secondary | ICD-10-CM | POA: Diagnosis not present

## 2022-07-01 DIAGNOSIS — L570 Actinic keratosis: Secondary | ICD-10-CM | POA: Diagnosis not present

## 2022-07-01 DIAGNOSIS — D2371 Other benign neoplasm of skin of right lower limb, including hip: Secondary | ICD-10-CM | POA: Diagnosis not present

## 2022-07-01 DIAGNOSIS — D1801 Hemangioma of skin and subcutaneous tissue: Secondary | ICD-10-CM | POA: Diagnosis not present

## 2022-07-01 MED ORDER — FLUOROURACIL 5 % EX CREA
TOPICAL_CREAM | CUTANEOUS | 0 refills | Status: AC
  Filled 2022-07-01 – 2022-07-27 (×2): qty 40, 14d supply, fill #0

## 2022-07-02 DIAGNOSIS — Z23 Encounter for immunization: Secondary | ICD-10-CM | POA: Diagnosis not present

## 2022-07-11 ENCOUNTER — Other Ambulatory Visit (HOSPITAL_COMMUNITY): Payer: Self-pay

## 2022-07-27 ENCOUNTER — Other Ambulatory Visit (HOSPITAL_COMMUNITY): Payer: Self-pay

## 2022-07-28 ENCOUNTER — Other Ambulatory Visit (HOSPITAL_COMMUNITY): Payer: Self-pay

## 2022-07-29 ENCOUNTER — Other Ambulatory Visit (HOSPITAL_COMMUNITY): Payer: Self-pay

## 2022-08-09 NOTE — Progress Notes (Unsigned)
No chief complaint on file.  History of Present Illness: 51 yo male with history of *** who is here today as a new consult, referred by Dr. ***, for the evaluation of ***.   Primary Care Physician: Antony Contras, MD   Past Medical History:  Diagnosis Date   COVID-19    Muscle weakness    Seasonal allergies     Past Surgical History:  Procedure Laterality Date   ACHILLES TENDON REPAIR Left 02/04/2021   DENTAL SURGERY     VASECTOMY      Current Outpatient Medications  Medication Sig Dispense Refill   aspirin 81 MG EC tablet Take 1 tablet by mouth twice a day 30 tablet 0   COVID-19 mRNA bivalent vaccine, Pfizer, (PFIZER COVID-19 VAC BIVALENT) injection Inject into the muscle. 0.3 mL 0   COVID-19 mRNA Vac-TriS, Pfizer, (PFIZER-BIONT COVID-19 VAC-TRIS) SUSP injection Inject into the muscle. 0.3 mL 0   EPINEPHrine (ADRENALIN) 0.1 % nasal solution Place 1 drop into the nose once.  (Patient not taking: Reported on 03/17/2021)     EPINEPHrine 0.3 mg/0.3 mL IJ SOAJ injection Inject 0.3 mLs (0.3 mg total) into the muscle once. (Patient not taking: Reported on 03/17/2021) 1 Device 0   Fexofenadine HCl (ALLEGRA PO) Take by mouth 1 day or 1 dose. (Patient not taking: Reported on 03/17/2021)     fluorouracil (EFUDEX) 5 % cream Apply a thin layer to affected areas of skin twice a day for 2 weeks, then stop. 40 g 0   ibuprofen (ADVIL,MOTRIN) 600 MG tablet Take 600 mg by mouth every 6 (six) hours as needed for mild pain. (Patient not taking: Reported on 03/17/2021)     influenza vac split quadrivalent PF (FLUARIX) 0.5 ML injection Inject into the muscle. 0.5 mL 0   montelukast (SINGULAIR) 4 MG chewable tablet Chew 4 mg by mouth at bedtime. (Patient not taking: Reported on 03/17/2021)     oxyCODONE (OXY IR/ROXICODONE) 5 MG immediate release tablet Take 1 tablet by mouth every 4 hours  as needed for 3 days. (Patient not taking: Reported on 03/17/2021) 18 tablet 0   No current facility-administered  medications for this visit.    No Known Allergies  Social History   Socioeconomic History   Marital status: Married    Spouse name: Not on file   Number of children: Not on file   Years of education: Not on file   Highest education level: Not on file  Occupational History   Not on file  Tobacco Use   Smoking status: Never   Smokeless tobacco: Never  Substance and Sexual Activity   Alcohol use: Yes   Drug use: No   Sexual activity: Not on file  Other Topics Concern   Not on file  Social History Narrative   Not on file   Social Determinants of Health   Financial Resource Strain: Not on file  Food Insecurity: Not on file  Transportation Needs: Not on file  Physical Activity: Not on file  Stress: Not on file  Social Connections: Not on file  Intimate Partner Violence: Not on file    No family history on file.  Review of Systems:  As stated in the HPI and otherwise negative.   There were no vitals taken for this visit.  Physical Examination: General: Well developed, well nourished, NAD  HEENT: OP clear, mucus membranes moist  SKIN: warm, dry. No rashes. Neuro: No focal deficits  Musculoskeletal: Muscle strength 5/5 all ext  Psychiatric: Mood and affect normal  Neck: No JVD, no carotid bruits, no thyromegaly, no lymphadenopathy.  Lungs:Clear bilaterally, no wheezes, rhonci, crackles Cardiovascular: Regular rate and rhythm. No murmurs, gallops or rubs. Abdomen:Soft. Bowel sounds present. Non-tender.  Extremities: No lower extremity edema. Pulses are 2 + in the bilateral DP/PT.  EKG:  EKG {ACTION; IS/IS BVA:[id number]} ordered today. The ekg ordered today demonstrates ***  Recent Labs: No results found for requested labs within last 365 days.   Lipid Panel No results found for: "CHOL", "TRIG", "HDL", "CHOLHDL", "VLDL", "LDLCALC", "LDLDIRECT"   Wt Readings from Last 3 Encounters:  02/07/18 193 lb 9.6 oz (87.8 kg)  02/16/17 190 lb 12.8 oz (86.5 kg)  02/11/16  188 lb (85.3 kg)      Assessment and Plan:   1.   Labs/ tests ordered today include:  No orders of the defined types were placed in this encounter.    Disposition:   F/U with me in ***    Signed, Lauree Chandler, MD, [facility] 08/09/2022 11:07 AM    [address], [place], [place]  [address] Phone: [phone]; Fax: [phone]

## 2022-08-10 ENCOUNTER — Ambulatory Visit: Payer: 59 | Attending: Cardiovascular Disease | Admitting: Cardiovascular Disease

## 2022-08-10 ENCOUNTER — Encounter: Payer: Self-pay | Admitting: Cardiovascular Disease

## 2022-08-10 VITALS — BP 104/70 | HR 65 | Ht 70.0 in | Wt 191.4 lb

## 2022-08-10 DIAGNOSIS — R0602 Shortness of breath: Secondary | ICD-10-CM | POA: Diagnosis not present

## 2022-08-10 NOTE — Patient Instructions (Addendum)
Medication Instructions:  No changes *If you need a refill on your cardiac medications before your next appointment, please call your pharmacy*   Lab Work: none   Testing/Procedures: Your physician has requested that you have an echocardiogram. Echocardiography is a painless test that uses sound waves to create images of your heart. It provides your doctor with information about the size and shape of your heart and how well your heart's chambers and valves are working. This procedure takes approximately one hour. There are no restrictions for this procedure. Please do NOT wear cologne, perfume, aftershave, or lotions (deodorant is allowed). Please arrive 15 minutes prior to your appointment time.   Follow-Up: At [facility], you and your health needs are our priority.  As part of our continuing mission to provide you with exceptional heart care, we have created designated Provider Care Teams.  These Care Teams include your primary Cardiologist (physician) and Advanced Practice Providers (APPs -  Physician Assistants and Nurse Practitioners) who all work together to provide you with the care you need, when you need it.   Your next appointment:   12 month(s)  The format for your next appointment:   In Person  Provider:   Lauree Chandler, MD   Important Information About Sugar

## 2022-08-28 ENCOUNTER — Ambulatory Visit (HOSPITAL_COMMUNITY): Payer: 59 | Attending: Cardiology

## 2022-08-28 DIAGNOSIS — R0602 Shortness of breath: Secondary | ICD-10-CM | POA: Diagnosis not present

## 2022-08-28 LAB — ECHOCARDIOGRAM COMPLETE
Area-P 1/2: 4.31 cm2
P 1/2 time: 871 msec
S' Lateral: 2 cm

## 2022-10-09 DIAGNOSIS — Z23 Encounter for immunization: Secondary | ICD-10-CM | POA: Diagnosis not present

## 2023-02-19 ENCOUNTER — Other Ambulatory Visit (HOSPITAL_COMMUNITY): Payer: Self-pay

## 2023-02-19 DIAGNOSIS — L710 Perioral dermatitis: Secondary | ICD-10-CM | POA: Diagnosis not present

## 2023-02-19 DIAGNOSIS — Z85828 Personal history of other malignant neoplasm of skin: Secondary | ICD-10-CM | POA: Diagnosis not present

## 2023-02-19 MED ORDER — METRONIDAZOLE 0.75 % EX CREA
TOPICAL_CREAM | CUTANEOUS | 11 refills | Status: AC
  Filled 2023-02-19: qty 45, 30d supply, fill #0

## 2023-05-02 IMAGING — MR MR ANKLE*L* W/O CM
5 of 6 series · 31 of 40 positions shown · non-contrast
Comparison: None.

CLINICAL DATA: Left ankle pain and swelling. Injury playing
basketball 2 days ago. Concern for Achilles tendon tear.

EXAM:
MRI OF THE LEFT ANKLE WITHOUT CONTRAST
TECHNIQUE: Multiplanar, multisequence MR imaging of the ankle was performed. No
intravenous contrast was administered.

[Series 3: T2 fat-sat · axial · left · 4.0mm · 0.42mm/px · z∈[-99,+131]mm · 8 of 47 slices shown (1 of 2)]
[im 1/47]
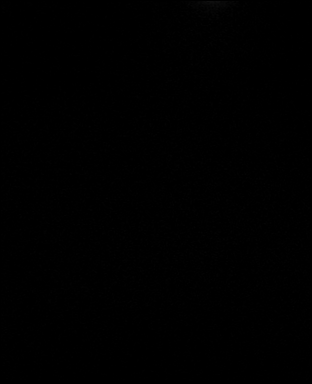
[im 7/47]
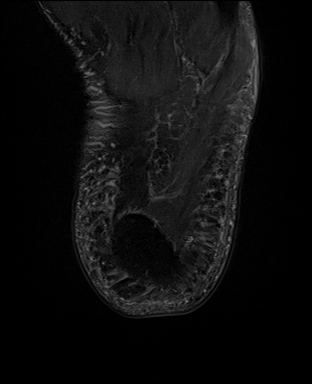
[im 14/47]
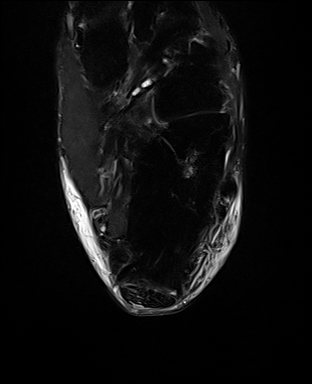
[im 20/47]
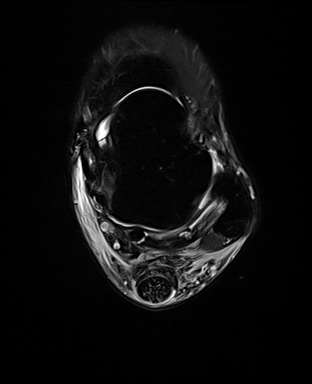
[im 27/47]
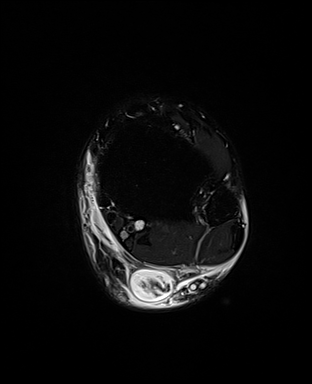
[im 33/47]
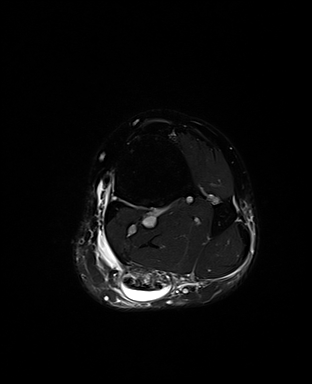
[im 40/47]
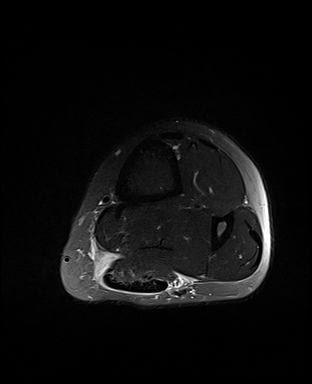
[im 47/47]
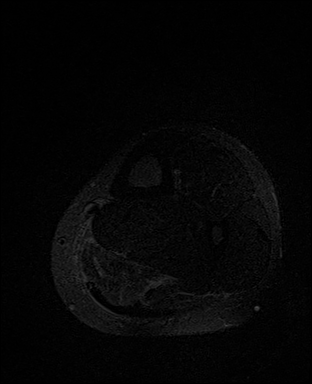

[Series 4: PD fat-sat · axial · left · 4.0mm · 0.50mm/px · z∈[-99,+131]mm · 8 of 47 slices shown]
[im 1/47]
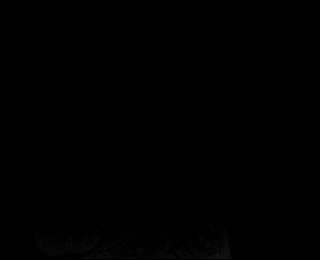
[im 7/47]
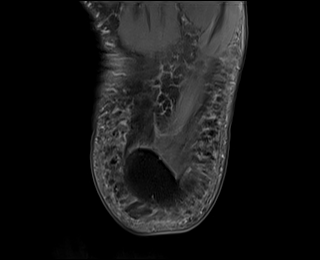
[im 14/47]
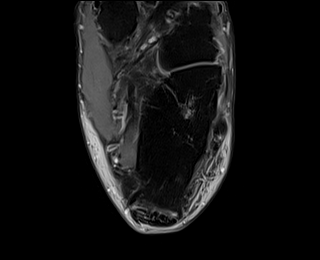
[im 20/47]
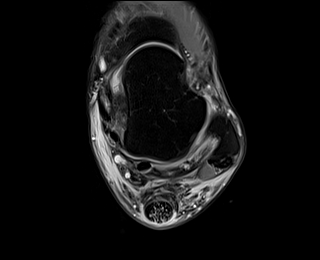
[im 27/47]
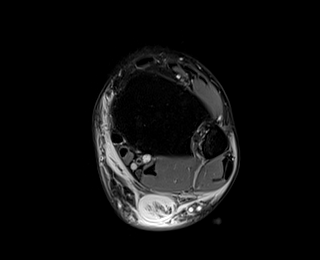
[im 33/47]
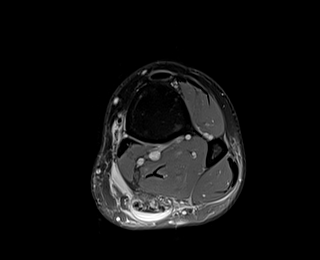
[im 40/47]
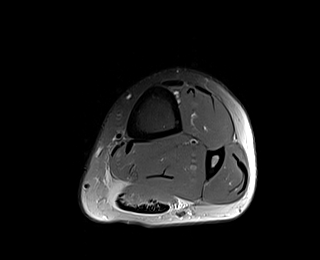
[im 47/47]
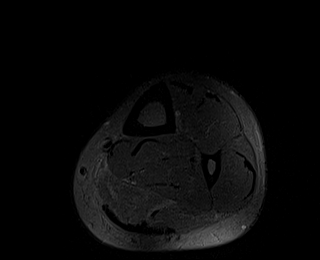

[Series 5: T2 fat-sat · coronal · left · 3.0mm · 0.59mm/px · 6 of 34 slices shown (2 of 2)]
[im 1/34]
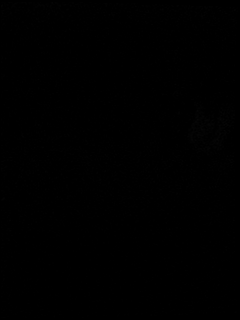
[im 7/34]
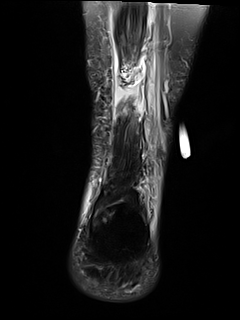
[im 14/34]
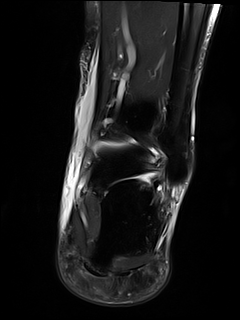
[im 20/34]
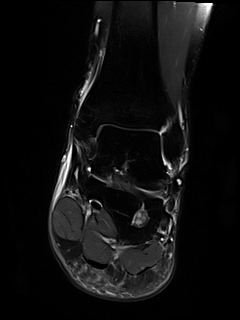
[im 27/34]
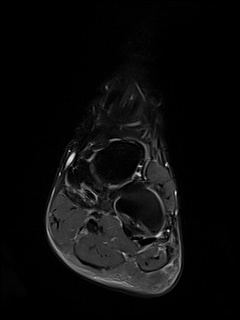
[im 34/34]
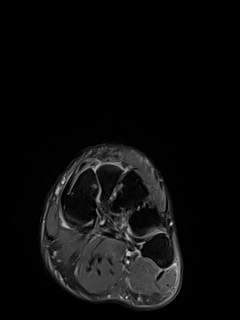

[Series 6: T1 · sagittal · left · 3.0mm · 0.56mm/px · 5 of 27 slices shown (1 of 2)]
[im 1/27]
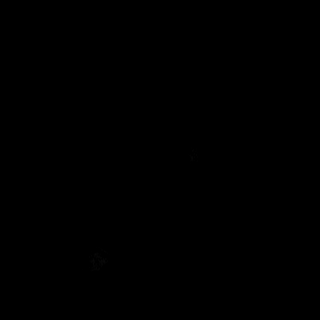
[im 7/27]
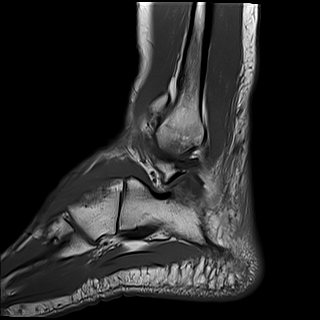
[im 14/27]
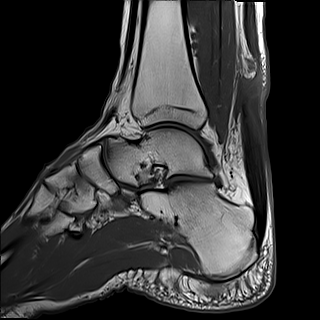
[im 20/27]
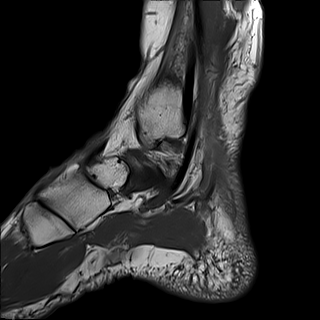
[im 27/27]
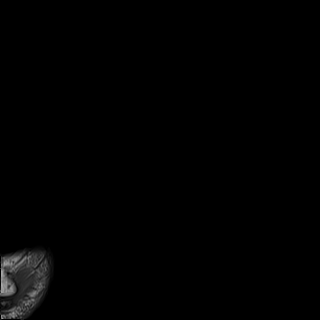

[Series 8: T1 · axial · left · 4.0mm · 0.21mm/px · z∈[-99,-4]mm · 4 of 47 slices shown (2 of 2)]
[im 1/47]
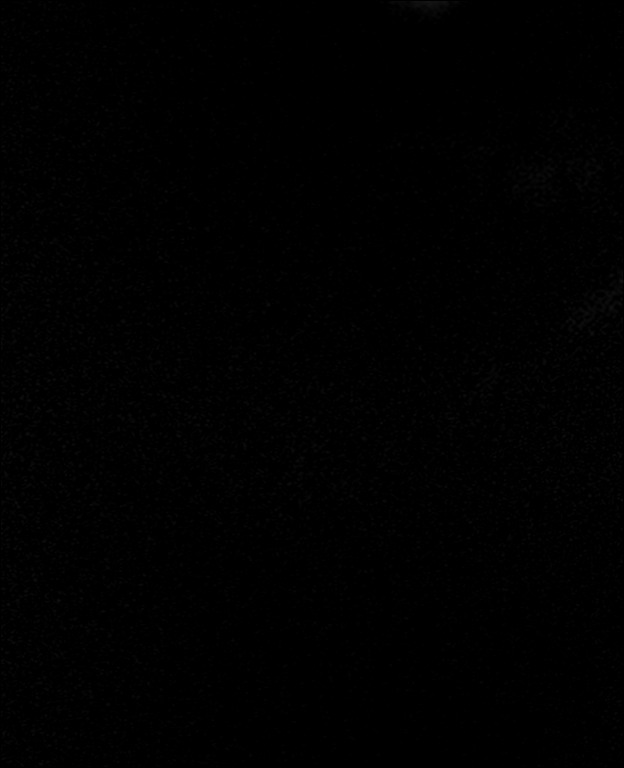
[im 7/47]
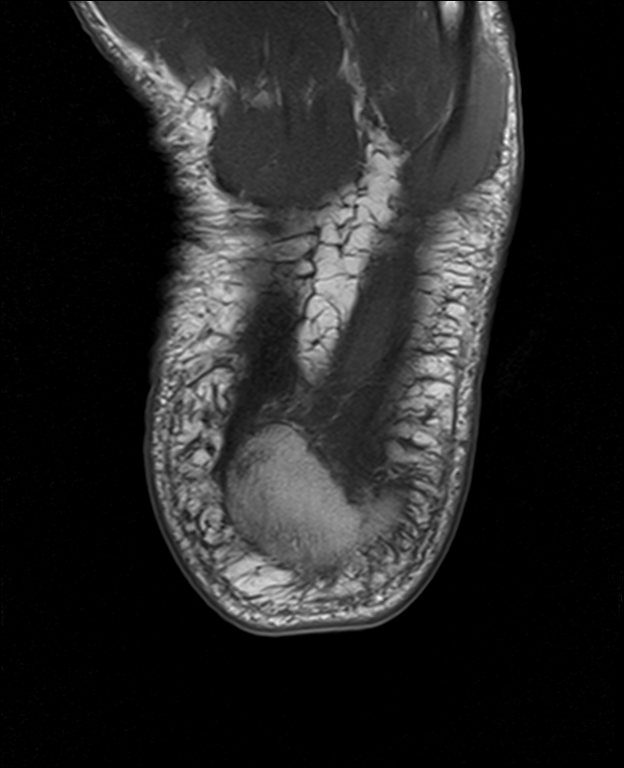
[im 14/47]
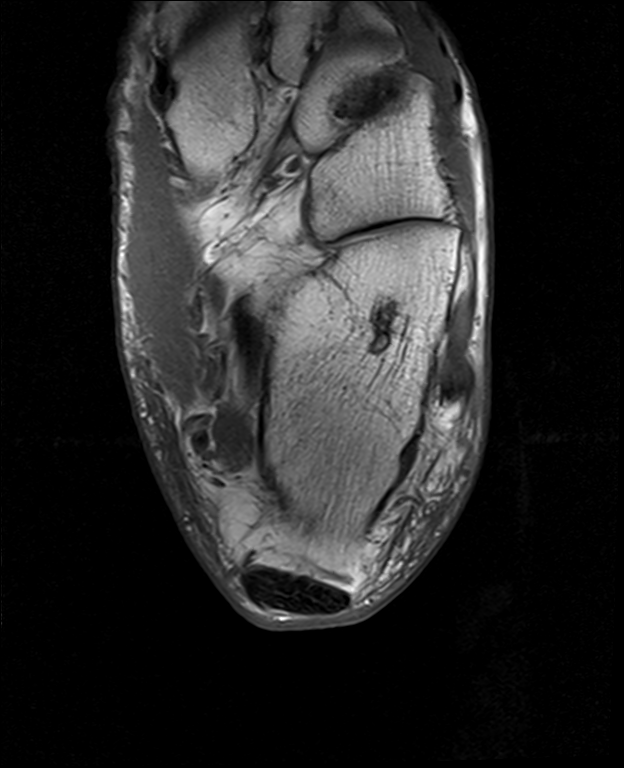
[im 20/47]
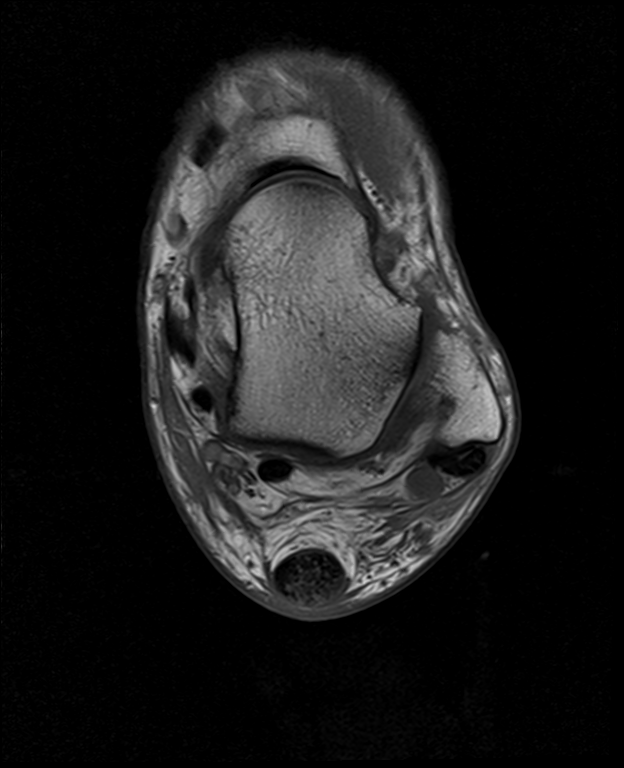

[31 of 40 positions shown; findings below may reference images not displayed]

FINDINGS: TENDONS

Peroneal: Peroneus brevis tendinosis with short segment split tear
of the infra malleolar portion of the tendon. Mild peroneus longus
tendinosis without evidence of tear.

Posteromedial: Intact tibialis posterior, flexor hallucis longus and
flexor digitorum longus tendons.

Anterior: Intact tibialis anterior, extensor hallucis longus and
extensor digitorum longus tendons.

Achilles: Complete rupture of the Achilles tendon located
approximately 9.0 cm proximal to the tendon insertion. Approximately
1.5 cm tendinous gap. Torn ends of the tendon are thickened and
heterogeneous compatible with underlying tendinosis. Complex
fluid/hemorrhage surrounds the torn tendon.

Plantar Fascia: Intact.

LIGAMENTS

Lateral: Anterior talofibular ligament appears irregular and
attenuated suggesting sequela of prior trauma. Posterior talofibular
ligament and calcaneofibular ligaments are intact. Intact anterior
and posterior tibiofibular ligaments.

Medial: Deltoid ligament and spring ligament complex intact.

CARTILAGE

Ankle Joint: No joint effusion or chondral defect.

Subtalar Joints/Sinus Tarsi: No joint effusion or chondral defect.
Preservation of the anatomic fat within the sinus tarsi.

Bones: Diffo Hansel alignment. No acute fracture or dislocation. Small
vascular remnant evident within the calcaneus adjacent to the
subtalar joint. No suspicious bone lesion.

Other: Soft tissue edema at the posterior lower leg and ankle. No
organized fluid collection or hematoma.
IMPRESSION: 1. Complete rupture of the Achilles tendon located approximately
cm proximal to the tendon insertion with a 1.5 cm tendinous gap.
Torn ends of the tendon are thickened and heterogeneous compatible
with underlying tendinosis. Surrounding edema/hemorrhage.
2. Peroneus brevis tendinosis with short segment split tear of the
infra-malleolar portion of the tendon. Mild peroneus longus
tendinosis without evidence of tear.
3. Diffo Hansel alignment.

## 2023-05-24 DIAGNOSIS — H5213 Myopia, bilateral: Secondary | ICD-10-CM | POA: Diagnosis not present

## 2023-07-05 DIAGNOSIS — Z85828 Personal history of other malignant neoplasm of skin: Secondary | ICD-10-CM | POA: Diagnosis not present

## 2023-07-05 DIAGNOSIS — D225 Melanocytic nevi of trunk: Secondary | ICD-10-CM | POA: Diagnosis not present

## 2023-07-05 DIAGNOSIS — D2372 Other benign neoplasm of skin of left lower limb, including hip: Secondary | ICD-10-CM | POA: Diagnosis not present

## 2023-07-05 DIAGNOSIS — L821 Other seborrheic keratosis: Secondary | ICD-10-CM | POA: Diagnosis not present

## 2023-07-05 DIAGNOSIS — D1801 Hemangioma of skin and subcutaneous tissue: Secondary | ICD-10-CM | POA: Diagnosis not present

## 2023-07-27 ENCOUNTER — Other Ambulatory Visit (HOSPITAL_BASED_OUTPATIENT_CLINIC_OR_DEPARTMENT_OTHER): Payer: Self-pay

## 2023-07-27 MED ORDER — COMIRNATY 30 MCG/0.3ML IM SUSY
PREFILLED_SYRINGE | INTRAMUSCULAR | 0 refills | Status: AC
  Filled 2023-07-27: qty 0.3, 1d supply, fill #0

## 2023-08-20 DIAGNOSIS — Z0000 Encounter for general adult medical examination without abnormal findings: Secondary | ICD-10-CM | POA: Diagnosis not present

## 2023-08-20 DIAGNOSIS — R7303 Prediabetes: Secondary | ICD-10-CM | POA: Diagnosis not present

## 2023-08-20 DIAGNOSIS — Z85828 Personal history of other malignant neoplasm of skin: Secondary | ICD-10-CM | POA: Diagnosis not present

## 2023-08-20 DIAGNOSIS — Z1211 Encounter for screening for malignant neoplasm of colon: Secondary | ICD-10-CM | POA: Diagnosis not present

## 2023-08-20 DIAGNOSIS — Z8719 Personal history of other diseases of the digestive system: Secondary | ICD-10-CM | POA: Diagnosis not present

## 2023-08-20 DIAGNOSIS — E7800 Pure hypercholesterolemia, unspecified: Secondary | ICD-10-CM | POA: Diagnosis not present

## 2023-08-20 DIAGNOSIS — J309 Allergic rhinitis, unspecified: Secondary | ICD-10-CM | POA: Diagnosis not present

## 2023-08-20 DIAGNOSIS — Z125 Encounter for screening for malignant neoplasm of prostate: Secondary | ICD-10-CM | POA: Diagnosis not present

## 2023-08-20 DIAGNOSIS — M545 Low back pain, unspecified: Secondary | ICD-10-CM | POA: Diagnosis not present

## 2023-11-18 ENCOUNTER — Other Ambulatory Visit: Payer: Self-pay | Admitting: Medical Genetics

## 2024-02-18 ENCOUNTER — Other Ambulatory Visit (HOSPITAL_COMMUNITY)
Admission: RE | Admit: 2024-02-18 | Discharge: 2024-02-18 | Disposition: A | Discharge status: Home / Self Care | Payer: Self-pay | Source: Ambulatory Visit | Attending: Oncology | Admitting: Oncology

## 2024-02-29 LAB — GENECONNECT MOLECULAR SCREEN: Genetic Analysis Overall Interpretation: NEGATIVE

## 2024-03-26 ENCOUNTER — Encounter: Payer: Self-pay | Admitting: Cardiovascular Disease

## 2024-04-26 ENCOUNTER — Other Ambulatory Visit: Payer: Self-pay | Admitting: *Deleted

## 2024-04-26 DIAGNOSIS — Z7189 Other specified counseling: Secondary | ICD-10-CM

## 2024-04-26 NOTE — Progress Notes (Signed)
 Reviewed with Dr. Verlin.  Okay to place order for calcium score CT scan.  Order placed.  Message/reply to patient to make him aware.

## 2024-05-17 ENCOUNTER — Ambulatory Visit: Payer: Self-pay | Admitting: Cardiovascular Disease

## 2024-05-17 ENCOUNTER — Ambulatory Visit (HOSPITAL_BASED_OUTPATIENT_CLINIC_OR_DEPARTMENT_OTHER)
Admission: RE | Admit: 2024-05-17 | Discharge: 2024-05-17 | Disposition: A | Discharge status: Home / Self Care | Payer: Self-pay | Source: Ambulatory Visit | Attending: Cardiovascular Disease | Admitting: Cardiovascular Disease

## 2024-05-17 DIAGNOSIS — Z7189 Other specified counseling: Secondary | ICD-10-CM | POA: Insufficient documentation

## 2024-06-05 ENCOUNTER — Other Ambulatory Visit (HOSPITAL_COMMUNITY): Payer: Self-pay

## 2024-06-05 ENCOUNTER — Other Ambulatory Visit: Payer: Self-pay | Admitting: *Deleted

## 2024-06-05 ENCOUNTER — Ambulatory Visit: Attending: Cardiovascular Disease | Admitting: Cardiovascular Disease

## 2024-06-05 ENCOUNTER — Encounter: Payer: Self-pay | Admitting: Cardiovascular Disease

## 2024-06-05 VITALS — BP 116/70 | HR 74 | Ht 70.0 in | Wt 193.0 lb

## 2024-06-05 DIAGNOSIS — I351 Nonrheumatic aortic (valve) insufficiency: Secondary | ICD-10-CM | POA: Diagnosis not present

## 2024-06-05 DIAGNOSIS — I2510 Atherosclerotic heart disease of native coronary artery without angina pectoris: Secondary | ICD-10-CM

## 2024-06-05 MED ORDER — ASPIRIN 81 MG PO TBEC: 81.0000 mg | DELAYED_RELEASE_TABLET | Freq: Every day | ORAL | Status: AC

## 2024-06-05 MED ORDER — ROSUVASTATIN CALCIUM 10 MG PO TABS
10.0000 mg | ORAL_TABLET | Freq: Every day | ORAL | 3 refills | Status: AC
  Filled 2024-06-05: qty 90, 90d supply, fill #0
  Filled 2024-09-05: qty 90, 90d supply, fill #1

## 2024-06-05 NOTE — Progress Notes (Signed)
 Chief Complaint  Patient presents with   Follow-up    Aortic valve disease CAD   History of Present Illness: 53 yo male with history of mild aortic valve insufficiency and CAD (abnormal coronary calcium score) who is here today for follow up. He was seen as a new patient in our office in December 2023. Echo December 2023 with normal LV systolic function. Mild AI. Cardiac CT calcium score of 33. In September 2025.   He is here today for follow up. The patient denies any chest pain, dyspnea, palpitations, lower extremity edema, orthopnea, PND, dizziness, near syncope or syncope.   Primary Care Physician: Seabron Lenis, MD   Past Medical History:  Diagnosis Date   Aortic insufficiency    CAD (coronary artery disease)    COVID-19    Muscle weakness    Seasonal allergies    Past Surgical History:  Procedure Laterality Date   ACHILLES TENDON REPAIR Left 02/04/2021   DENTAL SURGERY     VASECTOMY      Current Outpatient Medications  Medication Sig Dispense Refill   aspirin  EC 81 MG tablet Take 1 tablet (81 mg total) by mouth daily. Swallow whole.     COVID-19 mRNA vaccine, Pfizer, (COMIRNATY ) syringe Inject into the muscle. 0.3 mL 0   fluticasone (FLONASE ALLERGY RELIEF) 50 MCG/ACT nasal spray 1 spray in each nostril Nasally Once a day     ibuprofen (ADVIL,MOTRIN) 600 MG tablet Take 600 mg by mouth every 6 (six) hours as needed for mild pain.     levocetirizine (XYZAL ALLERGY 24HR) 5 MG tablet Take 5 mg by mouth every evening.     rosuvastatin (CRESTOR) 10 MG tablet Take 1 tablet (10 mg total) by mouth daily. 90 tablet 3   fluorouracil  (EFUDEX ) 5 % cream Apply a thin layer to affected areas of skin twice a day for 2 weeks, then stop. (Patient not taking: Reported on 06/05/2024) 40 g 0   metroNIDAZOLE  (METROCREAM ) 0.75 % cream Apply a small amount to affected area twice a day as directed. (Patient not taking: Reported on 06/05/2024) 45 g 11   No current facility-administered  medications for this visit.    No Known Allergies  Social History   Socioeconomic History   Marital status: Married    Spouse name: Not on file   Number of children: 2   Years of education: Not on file   Highest education level: Not on file  Occupational History   Occupation: CFO [employer]  Tobacco Use   Smoking status: Never   Smokeless tobacco: Never  Substance and Sexual Activity   Alcohol use: Yes   Drug use: No   Sexual activity: Not on file  Other Topics Concern   Not on file  Social History Narrative   Not on file   Social Drivers of Health   Financial Resource Strain: Not on file  Food Insecurity: Not on file  Transportation Needs: Not on file  Physical Activity: Not on file  Stress: Not on file  Social Connections: Not on file  Intimate Partner Violence: Not on file    Family History  Problem Relation Age of Onset   Aortic stenosis Father    Mitral valve prolapse Brother     Review of Systems:  As stated in the HPI and otherwise negative.   BP 116/70   Pulse 74   Ht 5' 10 (1.778 m)   Wt 193 lb (87.5 kg)   SpO2 95%  BMI 27.69 kg/m   Physical Examination: General: Well developed, well nourished, NAD  HEENT: OP clear, mucus membranes moist  SKIN: warm, dry. No rashes. Neuro: No focal deficits  Musculoskeletal: Muscle strength 5/5 all ext  Psychiatric: Mood and affect normal  Neck: No JVD, no carotid bruits, no thyromegaly, no lymphadenopathy.  Lungs:Clear bilaterally, no wheezes, rhonci, crackles Cardiovascular: Regular rate and rhythm. No murmurs, gallops or rubs. Abdomen:Soft. Bowel sounds present. Non-tender.  Extremities: No lower extremity edema. Pulses are 2 + in the bilateral DP/PT.  EKG:  EKG is ordered today. The ekg ordered today demonstrates  EKG Interpretation Date/Time:  Monday June 05 2024 14:41:50 EDT Ventricular Rate:  77 PR Interval:  154 QRS Duration:  86 QT Interval:  374 QTC Calculation: 423 R  Axis:   82  Text Interpretation: Normal sinus rhythm Normal ECG Confirmed by Verlin Bruckner [phone]) on 06/05/2024 2:49:20 PM   Recent Labs: No results found for requested labs within last 365 days.   Lipid Panel No results found for: CHOL, TRIG, HDL, CHOLHDL, VLDL, LDLCALC, LDLDIRECT   Wt Readings from Last 3 Encounters:  06/05/24 193 lb (87.5 kg)  08/10/22 191 lb 6.4 oz (86.8 kg)  02/07/18 193 lb 9.6 oz (87.8 kg)    Assessment and Plan:   1. CAD without angina: Coronary CT calcium score of 33 in September 2025. No chest pain. Will start ASA 81 mg daily and Crestor 10 mg daily. Lipids and LFTs in 12 weeks.   2. Aortic valve insufficiency: Mild by echo in December 2023. No murmur on exam. Repeat echo in December 2026.  Labs/ tests ordered today include:   Orders Placed This Encounter  Procedures   Lipid Profile   Hepatic function panel   EKG 12-Lead   Disposition:   F/U with me in one year    Signed, Bruckner Verlin, MD, [facility] 06/05/2024 3:12 PM    [facility] [address] [place], [place], [place]  [address] Phone: [phone]; Fax: [phone]

## 2024-06-05 NOTE — Patient Instructions (Signed)
 Medication Instructions:  Your physician has recommended you make the following change in your medication: Start aspirin  81 mg by mouth daily  Start Rosuvastatin 10 mg by mouth daily   *If you need a refill on your cardiac medications before your next appointment, please call your pharmacy*  Lab Work: Have fasting lab work done in about 12 weeks.  Lipid and liver profiles.  Can be done at any [facility] location.  There is a [facility] on the first floor of our building If you have labs (blood work) drawn today and your tests are completely normal, you will receive your results only by: MyChart Message (if you have MyChart) OR A paper copy in the mail If you have any lab test that is abnormal or we need to change your treatment, we will call you to review the results.  Testing/Procedures: none  Follow-Up: At [facility], you and your health needs are our priority.  As part of our continuing mission to provide you with exceptional heart care, our providers are all part of one team.  This team includes your primary Cardiologist (physician) and Advanced Practice Providers or APPs (Physician Assistants and Nurse Practitioners) who all work together to provide you with the care you need, when you need it.  Your next appointment:   12 month(s)  Provider:   Lonni Cash, MD    We recommend signing up for the patient portal called MyChart.  Sign up information is provided on this After Visit Summary.  MyChart is used to connect with patients for Virtual Visits (Telemedicine).  Patients are able to view lab/test results, encounter notes, upcoming appointments, etc.  Non-urgent messages can be sent to your provider as well.   To learn more about what you can do with MyChart, go to [email or web address].   Other Instructions

## 2024-06-15 DIAGNOSIS — R931 Abnormal findings on diagnostic imaging of heart and coronary circulation: Secondary | ICD-10-CM | POA: Diagnosis not present

## 2024-06-15 DIAGNOSIS — Z1211 Encounter for screening for malignant neoplasm of colon: Secondary | ICD-10-CM | POA: Diagnosis not present

## 2024-06-15 DIAGNOSIS — Z85828 Personal history of other malignant neoplasm of skin: Secondary | ICD-10-CM | POA: Diagnosis not present

## 2024-06-15 DIAGNOSIS — M545 Low back pain, unspecified: Secondary | ICD-10-CM | POA: Diagnosis not present

## 2024-06-15 DIAGNOSIS — Z0000 Encounter for general adult medical examination without abnormal findings: Secondary | ICD-10-CM | POA: Diagnosis not present

## 2024-06-15 DIAGNOSIS — E7800 Pure hypercholesterolemia, unspecified: Secondary | ICD-10-CM | POA: Diagnosis not present

## 2024-06-15 DIAGNOSIS — Z125 Encounter for screening for malignant neoplasm of prostate: Secondary | ICD-10-CM | POA: Diagnosis not present

## 2024-06-15 DIAGNOSIS — J309 Allergic rhinitis, unspecified: Secondary | ICD-10-CM | POA: Diagnosis not present

## 2024-06-15 DIAGNOSIS — Z8719 Personal history of other diseases of the digestive system: Secondary | ICD-10-CM | POA: Diagnosis not present

## 2024-06-15 DIAGNOSIS — R7303 Prediabetes: Secondary | ICD-10-CM | POA: Diagnosis not present

## 2024-06-20 DIAGNOSIS — D2371 Other benign neoplasm of skin of right lower limb, including hip: Secondary | ICD-10-CM | POA: Diagnosis not present

## 2024-06-20 DIAGNOSIS — L738 Other specified follicular disorders: Secondary | ICD-10-CM | POA: Diagnosis not present

## 2024-06-20 DIAGNOSIS — Z85828 Personal history of other malignant neoplasm of skin: Secondary | ICD-10-CM | POA: Diagnosis not present

## 2024-06-20 DIAGNOSIS — L821 Other seborrheic keratosis: Secondary | ICD-10-CM | POA: Diagnosis not present

## 2024-06-20 DIAGNOSIS — L814 Other melanin hyperpigmentation: Secondary | ICD-10-CM | POA: Diagnosis not present

## 2024-06-20 DIAGNOSIS — D2372 Other benign neoplasm of skin of left lower limb, including hip: Secondary | ICD-10-CM | POA: Diagnosis not present

## 2024-06-20 DIAGNOSIS — L570 Actinic keratosis: Secondary | ICD-10-CM | POA: Diagnosis not present

## 2024-06-20 DIAGNOSIS — D1801 Hemangioma of skin and subcutaneous tissue: Secondary | ICD-10-CM | POA: Diagnosis not present
